# Patient Record
Sex: Male | Born: 1935 | Race: White | Hispanic: No | Smoking: Never smoker
Health system: Southern US, Community
[De-identification: ages and names within clinical notes are randomized; demographics above are authoritative.]

## PROBLEM LIST (undated history)

## (undated) DIAGNOSIS — G20 Parkinson's disease: Secondary | ICD-10-CM

## (undated) DIAGNOSIS — Z950 Presence of cardiac pacemaker: Secondary | ICD-10-CM

## (undated) DIAGNOSIS — E669 Obesity, unspecified: Secondary | ICD-10-CM

## (undated) DIAGNOSIS — R011 Cardiac murmur, unspecified: Secondary | ICD-10-CM

## (undated) DIAGNOSIS — M109 Gout, unspecified: Secondary | ICD-10-CM

## (undated) DIAGNOSIS — J449 Chronic obstructive pulmonary disease, unspecified: Secondary | ICD-10-CM

## (undated) DIAGNOSIS — I517 Cardiomegaly: Secondary | ICD-10-CM

## (undated) DIAGNOSIS — I509 Heart failure, unspecified: Secondary | ICD-10-CM

## (undated) DIAGNOSIS — K219 Gastro-esophageal reflux disease without esophagitis: Secondary | ICD-10-CM

## (undated) DIAGNOSIS — I10 Essential (primary) hypertension: Secondary | ICD-10-CM

## (undated) DIAGNOSIS — E785 Hyperlipidemia, unspecified: Secondary | ICD-10-CM

## (undated) DIAGNOSIS — G4730 Sleep apnea, unspecified: Secondary | ICD-10-CM

## (undated) DIAGNOSIS — E119 Type 2 diabetes mellitus without complications: Secondary | ICD-10-CM

## (undated) DIAGNOSIS — G4733 Obstructive sleep apnea (adult) (pediatric): Secondary | ICD-10-CM

## (undated) DIAGNOSIS — R739 Hyperglycemia, unspecified: Secondary | ICD-10-CM

## (undated) DIAGNOSIS — I2510 Atherosclerotic heart disease of native coronary artery without angina pectoris: Secondary | ICD-10-CM

## (undated) HISTORY — DX: Type 2 diabetes mellitus without complications: E11.9

## (undated) HISTORY — PX: APPENDECTOMY: SHX54

## (undated) HISTORY — DX: Parkinson's disease: G20

## (undated) HISTORY — DX: Obesity, unspecified: E66.9

## (undated) HISTORY — DX: Gout, unspecified: M10.9

## (undated) HISTORY — DX: Cardiac murmur, unspecified: R01.1

## (undated) HISTORY — DX: Hyperlipidemia, unspecified: E78.5

## (undated) HISTORY — DX: Presence of cardiac pacemaker: Z95.0

## (undated) HISTORY — DX: Gastro-esophageal reflux disease without esophagitis: K21.9

## (undated) HISTORY — DX: Hyperglycemia, unspecified: R73.9

## (undated) HISTORY — DX: Essential (primary) hypertension: I10

## (undated) HISTORY — PX: KNEE ARTHROSCOPY: SUR90

## (undated) HISTORY — DX: Atherosclerotic heart disease of native coronary artery without angina pectoris: I25.10

## (undated) HISTORY — DX: Sleep apnea, unspecified: G47.30

---

## 1999-11-27 ENCOUNTER — Ambulatory Visit: Admission: RE | Admit: 1999-11-27 | Discharge: 1999-11-27 | Payer: Self-pay | Admitting: Internal Medicine

## 2000-02-11 ENCOUNTER — Ambulatory Visit: Admission: RE | Admit: 2000-02-11 | Discharge: 2000-02-11 | Payer: Self-pay | Admitting: Internal Medicine

## 2004-08-05 HISTORY — PX: COLONOSCOPY: SHX174

## 2006-06-22 ENCOUNTER — Ambulatory Visit: Payer: Self-pay | Admitting: Internal Medicine

## 2006-10-24 ENCOUNTER — Ambulatory Visit: Payer: Self-pay | Admitting: Internal Medicine

## 2006-12-13 DIAGNOSIS — G20 Parkinson's disease: Secondary | ICD-10-CM

## 2006-12-13 DIAGNOSIS — G20A1 Parkinson's disease without dyskinesia, without mention of fluctuations: Secondary | ICD-10-CM

## 2006-12-13 HISTORY — DX: Parkinson's disease: G20

## 2006-12-13 HISTORY — PX: CORONARY ARTERY BYPASS GRAFT: SHX141

## 2006-12-13 HISTORY — DX: Parkinson's disease without dyskinesia, without mention of fluctuations: G20.A1

## 2007-07-28 ENCOUNTER — Ambulatory Visit: Payer: Self-pay | Admitting: Internal Medicine

## 2007-10-04 ENCOUNTER — Encounter: Payer: Self-pay | Admitting: Internal Medicine

## 2007-10-14 ENCOUNTER — Encounter: Payer: Self-pay | Admitting: Internal Medicine

## 2007-11-13 ENCOUNTER — Encounter: Payer: Self-pay | Admitting: Internal Medicine

## 2007-12-14 ENCOUNTER — Encounter: Payer: Self-pay | Admitting: Internal Medicine

## 2008-01-14 ENCOUNTER — Encounter: Payer: Self-pay | Admitting: Internal Medicine

## 2008-12-13 HISTORY — PX: CATARACT EXTRACTION: SUR2

## 2009-07-10 ENCOUNTER — Ambulatory Visit: Payer: Self-pay | Admitting: Ophthalmology

## 2009-07-21 ENCOUNTER — Ambulatory Visit: Payer: Self-pay | Admitting: Ophthalmology

## 2012-05-09 ENCOUNTER — Ambulatory Visit: Payer: Self-pay | Admitting: Ophthalmology

## 2012-05-22 ENCOUNTER — Ambulatory Visit: Payer: Self-pay | Admitting: Ophthalmology

## 2013-11-02 ENCOUNTER — Ambulatory Visit: Payer: Self-pay | Admitting: Internal Medicine

## 2014-08-26 ENCOUNTER — Encounter: Payer: Self-pay | Admitting: Neurology

## 2014-11-02 DIAGNOSIS — E1122 Type 2 diabetes mellitus with diabetic chronic kidney disease: Secondary | ICD-10-CM | POA: Insufficient documentation

## 2014-11-02 DIAGNOSIS — N182 Chronic kidney disease, stage 2 (mild): Secondary | ICD-10-CM

## 2014-11-11 DIAGNOSIS — Z6835 Body mass index (BMI) 35.0-35.9, adult: Secondary | ICD-10-CM

## 2014-12-13 HISTORY — PX: PACEMAKER PLACEMENT: SHX43

## 2014-12-24 ENCOUNTER — Emergency Department: Payer: Self-pay | Admitting: Emergency Medicine

## 2014-12-24 LAB — CBC
HCT: 43.2 % (ref 40.0–52.0)
HGB: 14.1 g/dL (ref 13.0–18.0)
MCH: 30 pg (ref 26.0–34.0)
MCHC: 32.7 g/dL (ref 32.0–36.0)
MCV: 92 fL (ref 80–100)
Platelet: 158 10*3/uL (ref 150–440)
RBC: 4.71 10*6/uL (ref 4.40–5.90)
RDW: 14.1 % (ref 11.5–14.5)
WBC: 10.8 10*3/uL — ABNORMAL HIGH (ref 3.8–10.6)

## 2014-12-24 LAB — COMPREHENSIVE METABOLIC PANEL
ALT: 10 U/L — AB
ANION GAP: 6 — AB (ref 7–16)
Albumin: 3.5 g/dL (ref 3.4–5.0)
Alkaline Phosphatase: 65 U/L
BILIRUBIN TOTAL: 0.5 mg/dL (ref 0.2–1.0)
BUN: 16 mg/dL (ref 7–18)
CHLORIDE: 109 mmol/L — AB (ref 98–107)
CREATININE: 1.02 mg/dL (ref 0.60–1.30)
Calcium, Total: 8.7 mg/dL (ref 8.5–10.1)
Co2: 28 mmol/L (ref 21–32)
EGFR (Non-African Amer.): >60
GLUCOSE: 101 mg/dL — AB (ref 65–99)
Osmolality: 286 (ref 275–301)
POTASSIUM: 4 mmol/L (ref 3.5–5.1)
SGOT(AST): 29 U/L (ref 15–37)
Sodium: 143 mmol/L (ref 136–145)
Total Protein: 7 g/dL (ref 6.4–8.2)

## 2014-12-24 LAB — TROPONIN I
Troponin-I: <0.02 ng/mL
Troponin-I: 0.02 ng/mL

## 2014-12-25 ENCOUNTER — Inpatient Hospital Stay: Payer: Self-pay | Admitting: Internal Medicine

## 2014-12-25 LAB — URINALYSIS, COMPLETE
Bacteria: NONE SEEN
Bilirubin,UR: NEGATIVE
Blood: NEGATIVE
Glucose,UR: NEGATIVE mg/dL
Hyaline Cast: 2
Leukocyte Esterase: NEGATIVE
Nitrite: NEGATIVE
Ph: 5
Protein: 30
RBC,UR: 1 /HPF
Specific Gravity: 1.029
Squamous Epithelial: 1
WBC UR: 6 /HPF

## 2014-12-25 LAB — COMPREHENSIVE METABOLIC PANEL WITH GFR
Albumin: 3.4 g/dL
Alkaline Phosphatase: 75 U/L
Anion Gap: 6 — ABNORMAL LOW
BUN: 17 mg/dL
Bilirubin,Total: 1 mg/dL
Calcium, Total: 8.5 mg/dL
Chloride: 105 mmol/L
Co2: 28 mmol/L
Creatinine: 1.01 mg/dL
EGFR (African American): >60
EGFR (Non-African Amer.): >60
Glucose: 135 mg/dL — ABNORMAL HIGH
Osmolality: 281
Potassium: 3.7 mmol/L
SGOT(AST): 22 U/L
SGPT (ALT): 11 U/L — ABNORMAL LOW
Sodium: 139 mmol/L
Total Protein: 7.2 g/dL

## 2014-12-25 LAB — CBC WITH DIFFERENTIAL/PLATELET
BASOS ABS: 0 10*3/uL (ref 0.0–0.1)
Basophil %: 0.3 %
EOS ABS: 0 10*3/uL (ref 0.0–0.7)
Eosinophil %: 0.4 %
HCT: 44.4 % (ref 40.0–52.0)
HGB: 14.4 g/dL (ref 13.0–18.0)
LYMPHS ABS: 0.6 10*3/uL — AB (ref 1.0–3.6)
Lymphocyte %: 5 %
MCH: 29.4 pg (ref 26.0–34.0)
MCHC: 32.4 g/dL (ref 32.0–36.0)
MCV: 91 fL (ref 80–100)
MONO ABS: 0.5 x10 3/mm (ref 0.2–1.0)
Monocyte %: 4.2 %
NEUTROS ABS: 10.3 10*3/uL — AB (ref 1.4–6.5)
Neutrophil %: 90.1 %
Platelet: 167 10*3/uL (ref 150–440)
RBC: 4.89 10*6/uL (ref 4.40–5.90)
RDW: 14 % (ref 11.5–14.5)
WBC: 11.4 10*3/uL — AB (ref 3.8–10.6)

## 2014-12-26 LAB — BASIC METABOLIC PANEL
Anion Gap: 7 (ref 7–16)
BUN: 16 mg/dL (ref 7–18)
CHLORIDE: 106 mmol/L (ref 98–107)
CREATININE: 0.99 mg/dL (ref 0.60–1.30)
Calcium, Total: 8.3 mg/dL — ABNORMAL LOW (ref 8.5–10.1)
Co2: 27 mmol/L (ref 21–32)
EGFR (African American): >60
EGFR (Non-African Amer.): >60
Glucose: 109 mg/dL — ABNORMAL HIGH (ref 65–99)
Osmolality: 281 (ref 275–301)
POTASSIUM: 4.1 mmol/L (ref 3.5–5.1)
SODIUM: 140 mmol/L (ref 136–145)

## 2014-12-26 LAB — CBC WITH DIFFERENTIAL/PLATELET
BASOS ABS: 0 10*3/uL (ref 0.0–0.1)
Basophil %: 0.3 %
Eosinophil #: 0 10*3/uL (ref 0.0–0.7)
Eosinophil %: 0.6 %
HCT: 39.7 % — ABNORMAL LOW (ref 40.0–52.0)
HGB: 13 g/dL (ref 13.0–18.0)
LYMPHS ABS: 0.6 10*3/uL — AB (ref 1.0–3.6)
Lymphocyte %: 6.8 %
MCH: 29.7 pg (ref 26.0–34.0)
MCHC: 32.7 g/dL (ref 32.0–36.0)
MCV: 91 fL (ref 80–100)
MONO ABS: 0.4 x10 3/mm (ref 0.2–1.0)
Monocyte %: 5 %
Neutrophil #: 7.7 10*3/uL — ABNORMAL HIGH (ref 1.4–6.5)
Neutrophil %: 87.3 %
Platelet: 149 10*3/uL — ABNORMAL LOW (ref 150–440)
RBC: 4.38 10*6/uL — ABNORMAL LOW (ref 4.40–5.90)
RDW: 14 % (ref 11.5–14.5)
WBC: 8.9 10*3/uL (ref 3.8–10.6)

## 2014-12-30 LAB — CBC WITH DIFFERENTIAL/PLATELET
Basophil #: 0.1 10*3/uL (ref 0.0–0.1)
Basophil %: 0.7 %
EOS ABS: 0.3 10*3/uL (ref 0.0–0.7)
Eosinophil %: 3.3 %
HCT: 40.4 % (ref 40.0–52.0)
HGB: 13.4 g/dL (ref 13.0–18.0)
Lymphocyte #: 0.9 10*3/uL — ABNORMAL LOW (ref 1.0–3.6)
Lymphocyte %: 11 %
MCH: 30 pg (ref 26.0–34.0)
MCHC: 33.2 g/dL (ref 32.0–36.0)
MCV: 90 fL (ref 80–100)
MONO ABS: 0.6 x10 3/mm (ref 0.2–1.0)
Monocyte %: 7.3 %
NEUTROS PCT: 77.7 %
Neutrophil #: 6.6 10*3/uL — ABNORMAL HIGH (ref 1.4–6.5)
Platelet: 182 10*3/uL (ref 150–440)
RBC: 4.47 10*6/uL (ref 4.40–5.90)
RDW: 14.2 % (ref 11.5–14.5)
WBC: 8.4 10*3/uL (ref 3.8–10.6)

## 2014-12-30 LAB — BASIC METABOLIC PANEL
ANION GAP: 4 — AB (ref 7–16)
BUN: 16 mg/dL (ref 7–18)
CALCIUM: 8.7 mg/dL (ref 8.5–10.1)
CO2: 34 mmol/L — AB (ref 21–32)
CREATININE: 0.96 mg/dL (ref 0.60–1.30)
Chloride: 104 mmol/L (ref 98–107)
EGFR (African American): >60
EGFR (Non-African Amer.): >60
Glucose: 112 mg/dL — ABNORMAL HIGH (ref 65–99)
Osmolality: 285 (ref 275–301)
Potassium: 4.1 mmol/L (ref 3.5–5.1)
Sodium: 142 mmol/L (ref 136–145)

## 2014-12-30 LAB — APTT: ACTIVATED PTT: 32.2 s (ref 23.6–35.9)

## 2014-12-30 LAB — PROTIME-INR
INR: 1
PROTHROMBIN TIME: 13.1 s (ref 11.5–14.7)

## 2015-01-08 ENCOUNTER — Ambulatory Visit: Payer: Self-pay | Admitting: Surgery

## 2015-04-03 ENCOUNTER — Encounter
Admit: 2015-04-03 | Disposition: A | Discharge status: Home / Self Care | Payer: Self-pay | Attending: Neurology | Admitting: Neurology

## 2015-04-06 NOTE — Op Note (Signed)
PATIENT NAME:  Jermaine [name], Nero G MR#:  [id number] DATE OF BIRTH:  1929-06-10  DATE OF PROCEDURE:  05/22/2012  PREOPERATIVE DIAGNOSIS:  Cataract, right eye.   POSTOPERATIVE DIAGNOSIS:  Cataract, right eye.  PROCEDURE PERFORMED:  Extracapsular cataract extraction using phacoemulsification with placement of an Alcon SN6CWS 20.0-diopter posterior chamber lens, serial #[id number].  SURGEON:  Maylon PeppersSteven A. Demitris Pokorny, MD  ASSISTANT:  None.  ANESTHESIA:  4% lidocaine and 0.75% Marcaine in a 50/50 mixture with 10 units/mL of Hylenex added, given as a peribulbar.   ANESTHESIOLOGIST:  Dr. Dimple Caseyice.   COMPLICATIONS:  None.  ESTIMATED BLOOD LOSS:  Less than 1 ml.  DESCRIPTION OF PROCEDURE:  The patient was brought to the operating room and given a peribulbar block.  The patient was then prepped and draped in the usual fashion.  The vertical rectus muscles were imbricated using 5-0 silk sutures.  These sutures were then clamped to the sterile drapes as bridle sutures.  A limbal peritomy was performed extending two clock hours and hemostasis was obtained with cautery.  A partial thickness scleral groove was made at the surgical limbus and dissected anteriorly in a lamellar dissection using an Alcon crescent knife.  The anterior chamber was entered superonasally with a Superblade and through the lamellar dissection with a 2.6 mm keratome.  DisCoVisc was used to replace the aqueous and a continuous tear capsulorrhexis was carried out.  Hydrodissection and hydrodelineation were carried out with balanced salt and a 27 gauge canula.  The nucleus was rotated to confirm the effectiveness of the hydrodissection.  Phacoemulsification was carried out using a divide-and-conquer technique.  Total ultrasound time was one minute and thirty-two seconds with an average power of 20.3 percent.  Irrigation/aspiration was used to remove the residual cortex.  DisCoVisc was used to inflate the capsule and the internal incision was  enlarged to 3 mm with the crescent knife.  The intraocular lens was folded and inserted into the capsular bag using the AcrySert delivery system.  Irrigation/aspiration was used to remove the residual DisCoVisc.  Miostat was injected into the anterior chamber through the paracentesis track to inflate the anterior chamber and induce miosis.  The wound was checked for leaks and none were found. The conjunctiva was closed with cautery and the bridle sutures were removed.  Two drops of 0.3% Vigamox were placed on the eye.   An eye shield was placed on the eye.  The patient was discharged to the recovery room in good condition.   ____________________________ Maylon PeppersSteven A. Lance Galas, MD sad:ap D: 05/22/2012 13:23:09 ET T: 05/22/2012 14:52:48 ET JOB#: [id number]  cc: Viviann SpareSteven A. Lilliann Rossetti, MD, <Dictator> Erline LevineSTEVEN A Silas Sedam MD ELECTRONICALLY SIGNED 05/29/2012 13:01

## 2015-04-13 NOTE — Op Note (Signed)
PATIENT NAME:  Jermaine [name], Jermaine [name] MR#:  [id number] DATE OF BIRTH:  27-Oct-1929  DATE OF PROCEDURE:  12/30/2014  PRIMARY CARE PHYSICIAN: Einar CrowMarshall Anderson, MD  PREPROCEDURE DIAGNOSIS: Sinus arrest.   PROCEDURE: Dual-chamber pacemaker implantation.  POSTPROCEDURAL DIAGNOSIS: Atrial pacing with ventricular sensing.   INDICATION: The patient is an 81 year old gentleman with known CAD with history of atrial fibrillation who presented after a syncopal episode with fractured ribs and pneumothorax. Following hospitalization, the patient has had episodes of sinus pauses of up to 6 seconds.   DESCRIPTION OF PROCEDURE: The risks, benefits, and alternatives of pacemaker implantation were explained to the patient and informed written consent was obtained.   DESCRIPTION OF PROCEDURE: He was brought to the operating room in a fasting state. The left pectoral region was prepped and draped in the usual sterile manner. Anesthesia was obtained with 1% Xylocaine locally. A 6 cm incision was performed over the left pectoral region. The pacemaker pocket was generated by electrocautery and blunt dissection. Access was obtained to the left subclavian vein by fine needle aspiration. Ventricular and atrial MRI compatible leads were positioned to right ventricular apical septum and right atrial appendage under fluoroscopic guidance. After proper thresholds were obtained, the leads were sutured in place. The pacemaker pocket was irrigated with gentamicin solution. The leads were connected to a dual-chamber rate responsive MRI compatible pacemaker generator (Medtronic [id number]) and positioned into the pocket. The pocket was closed with 2-0 and 4-0 Vicryl, respectively. Steri-Strips and pressure dressing were applied.  ____________________________ Jermaine MillardAlexander Lyrik Buresh, MD ap:sb D: 12/30/2014 13:33:45 ET T: 12/30/2014 14:15:59 ET JOB#: [id number]  cc: Jermaine MillardAlexander Francisco Ostrovsky, MD, <Dictator> Jermaine MillardALEXANDER Ivori Storr MD ELECTRONICALLY SIGNED  01/01/2015 18:07

## 2015-04-13 NOTE — Discharge Summary (Signed)
PATIENT NAME:  Jermaine [name], [name] MR#:  [id number] DATE OF BIRTH:  [birth year]  DATE OF ADMISSION:  12/25/2014 DATE OF DISCHARGE:  01/01/2015  DISCHARGE DIAGNOSES:  1. Pneumothorax.  2. Rib fractures.  3. Syncope.  4. Intermittent complete heart block requiring a pacemaker. 5. Permanent pacemaker.  6. Parkinson disease.   DISCHARGE MEDICATIONS: [facility]. Please see for details. Notably will be off spironolactone and amiodarone per cardiology. They will need to watch for rapid atrial fibrillation and the pacemaker. Also, will not be anticoagulated until the risk of falls is clearly improved. We are also decreasing doxazosin 2 mg daily to help with orthostatic hypotension. May consider titrating that off and/or decreasing isosorbide if need be, and also possibly adding thigh-high TED hose.   HISTORY AND PHYSICAL: Please see detailed history and physical done on admission.   HOSPITAL COURSE: The patient was admitted with the above, followed by surgery for the pneumothorax and rib fractures. Ultimately found to have periodic complete heart block with pauses up to 5 seconds. Permanent pacemaker was recommended by Dr. Gwen PoundsKowalski, and Dr. Darrold JunkerParaschos placed that. Dr. Thelma Bargeaks saw the patient about the pneumothorax. A chest tube was placed and eventually withdrawn without any further accumulation of air. Last chest x-ray was January 18.   Notably, the patient was felt to be ready to be discharged by all consultants and the surgery service. No one had seen him from surgery in a couple of days, so I  will discharge the patient today if okay with general surgery. We will try to check with them.   Notably, a CT scan from earlier in the admission showed the above rib fractures, tiny gallstones only, which did not appear to be symptomatic.   TIME SPENT: Please note it took approximately 35 minutes to do all discharge tasks today.   ____________________________ Marya AmslerMarshall W. Dareen PianoAnderson,  MD mwa:JT D: 01/01/2015 07:56:56 ET T: 01/01/2015 08:59:23 ET JOB#: [id number]  cc: Marya AmslerMarshall W. Dareen PianoAnderson, MD, <Dictator> Lauro RegulusMARSHALL W ANDERSON MD ELECTRONICALLY SIGNED 01/02/2015 13:13

## 2015-04-13 NOTE — Consult Note (Signed)
PATIENT NAME:  Jermaine [name], Jermaine [name] MR#:  [id number] DATE OF BIRTH:  03-10-1929  DATE OF CONSULTATION:  12/25/2014  REFERRING PHYSICIAN:  Carmie Endalph L. Ely III, MD  CONSULTING PHYSICIAN:  Cletis Athensavid K. Hower, MD  PRIMARY CARE PHYSICIAN: Marya AmslerMarshall W. Dareen PianoAnderson, MD, of [facility].  REASON FOR CONSULTATION: Management of medical issues  HISTORY OF PRESENT ILLNESS: This is an 81 year old Caucasian gentleman with history of coronary artery disease status post CABG, as well as a history of Parkinson's, essential hypertension, who originally presented on 12/24/2014 after a syncopal episode. At that time it sounds as though it was positional in etiology, going from sitting to standing, he felt lightheaded, followed by a syncopal episode with loss of consciousness. No loss of bowel or bladder function. No head trauma. He woke up promptly without any postictal confusion. Had full workup in the Emergency Department. The case was [facility], and he was discharged from the Emergency Department. Of note, he recently started spironolactone about 2 weeks prior to this and it was felt that he had orthostatic hypotension related to the change in medications. This was discharged. Also of note, he previously had syncopal episodes and underwent Holter monitoring, which was negative for any acute findings. Once again, discharged home. While at home the day of admission, 12/25/2014, noticed worsening right-sided chest pain, mid axillary in location, "pain" in quality, 8-9 out of 10 intensity, worse with movement and breathing, no relieving factors. Thus, represented to the hospital for further workup and evaluation. Found to have acute right-sided rib fractures of 7, 8, and 9 with a small pneumothorax. He was admitted by Dr. Michela PitcherEly of general surgery at that time. Medical consult requested for medical management as stated above.   REVIEW OF SYSTEMS:  CONSTITUTIONAL: Denies any fevers,  chills, weakness, fatigue.  EYES: Denies blurred vision, double vision, or eye pain.  EARS, NOSE, THROAT: Denies tinnitus, ear pain, hearing loss.  RESPIRATORY: Denies any cough, wheeze, shortness of breath.  CARDIOVASCULAR: Positive for chest pain as described above. Denies any palpitations or edema.  GASTROINTESTINAL: Denies nausea, vomiting, diarrhea, or abdominal pain.  GENITOURINARY: Denies dysuria or hematuria.  ENDOCRINE: Denies nocturia or thyroid problems.  HEMATOLOGIC AND LYMPHATIC: Denies easy bruising, bleeding.  SKIN: Denies rashes or lesions.  MUSCULOSKELETAL: Denies pain in head, neck, back, shoulder, knees, hips, or arthritic symptoms.  NEUROLOGIC: Denies paralysis or paresthesias.  PSYCHIATRIC: Denies anxiety or depressive symptoms. Otherwise a full review of systems performed by me is negative.   PAST MEDICAL HISTORY: Coronary artery disease, status post CABG; Parkinson disease; essential hypertension; as well as gout.   SOCIAL HISTORY: No alcohol, tobacco, or drug usage.   FAMILY HISTORY: No known cardiovascular or pulmonary disorders.   ALLERGIES: No known drug allergies.   HOME MEDICATIONS: The patient did not bring his medication list, however, per our documentation: Acetaminophen and oxycodone 325/5 mg p.o. q 6 hours as needed for pain; allopurinol 3 mg 1/2 tablet p.o. daily; amiodarone 200 mg p.o. daily; Norvasc 10 mg p.o. at bedtime; aspirin 325 mg p.o. at bedtime; Sinemet 25/250 mg 2 tablets p.o. 4 times daily; Dilaudid 2 mg p.o. 4 times daily as needed for pain; doxazosin 8 mg 1/2 tablet p.o. daily; hydrochlorothiazide/lisinopril 12.5/20 mg p.o. daily; ibuprofen 600 mg 3 times daily as needed for pain; Imdur 30 mg p.o. daily; melatonin 5 mg p.o. at bedtime; MiraLax 17 grams daily as needed for constipation; potassium 20 mEq p.o. daily; Requip 1 mg p.o. 4  times daily; spironolactone 25 mg p.o. daily; vitamin D2 at 50,000 international units p.o. q. weekly; vitamin D3  at 1000 international units p.o. daily.   PHYSICAL EXAMINATION:  VITAL SIGNS: Temperature 97.4, heart rate 84, respirations 20, blood pressure 156/53, saturating 95% on 2 L nasal cannula. Weight 109.5 kg, BMI 36.7.  GENERAL: Obese, Caucasian gentleman, currently in minimal distress given right-sided rib pain.  HEAD: Normocephalic, atraumatic.  EYES: Pupils equal, round, reactive to reactive to light. Extraocular muscles intact. No scleral icterus.  MOUTH: Moist mucosal membrane. Dentition intact. No abscess noted.  EAR, NOSE, THROAT: Clear without exudates. No external lesions.  NECK: Supple. No thyromegaly. No nodules. No JVD.  PULMONARY: Diminished breath sounds secondary to poor respiratory effort. Otherwise, no frank wheezes, rales, or rhonchi.  CHEST: Tender to palpation of right side as expected.  CARDIOVASCULAR: S1, S2, regular rate and rhythm. No murmurs, rubs, or gallops. No edema. Pedal pulses 2+ bilaterally.   GASTROINTESTINAL: Soft, nontender, nondistended. No masses. Positive bowel sounds. No hepatosplenomegaly.   MUSCULOSKELETAL: No swelling, clubbing, or edema. Range of motion full in all extremities.  NEUROLOGIC: Cranial nerves II-XII intact. No gross focal neurological deficits. Sensation intact. Reflexes intact.  SKIN: No ulceration, lesions, rash, cyanosis. Skin warm and dry. Turgor intact.  PSYCHIATRIC: Mood and affect are within normal limits. Patient awake, alert, providing history. Insight and judgment intact.   LABORATORY DATA: Sodium of 139, potassium 3.7, chloride 105, bicarbonate 28, BUN 17, creatinine 1.01, glucose 135. LFTs within normal limits. WBC of 11.4, hemoglobin 14.4, platelets of 167,000. Urinalysis negative for evidence of infection. CT of the abdomen and pelvis performed today revealing mildly displaced right seventh through ninth rib fractures with a small right-sided pneumothorax.   ASSESSMENT AND PLAN: An 81 year old Caucasian gentleman with a history of  coronary artery disease, status post coronary artery bypass graft; Parkinson disease; as well as essential hypertension who is presenting after a syncopal episode, found to have a right-sided rib fractures.  1.  Right-sided rib fractures, acute initial visit: Provide pain control. Currently on p.r.n. Dilaudid. If it remains uncontrolled we will place him on PCA. We will also initiate bowel regimen and incentive spirometry.  2.  Right-sided pneumothorax: Repeat chest x-ray in the morning and we will defer care to surgery.  3.  Hypertension: Hold spironolactone. Continue with Norvasc, Imdur, hydrochlorothiazide, and lisinopril. If blood pressure lowers or is marginal, we will discontinue hydrochlorothiazide at that time but can continue for now.  4.  Hyperlipidemia: Continue statin therapy.  5.  Parkinson's: Continue Sinemet.   CODE STATUS: The patient is full code.   TIME SPENT: 45 minutes.    ____________________________ Cletis Athens. Hower, MD dkh:bm D: 12/25/2014 23:28:00 ET T: 12/26/2014 02:00:10 ET JOB#: [id number]  cc: Cletis Athens. Hower, MD, <Dictator> DAVID Synetta Shadow MD ELECTRONICALLY SIGNED 12/27/2014 1:01

## 2015-04-13 NOTE — Consult Note (Signed)
PATIENT NAME:  Jermaine [name], Jermaine [name] MR#:  [id number] DATE OF BIRTH:  Jun 14, 1929  DATE OF CONSULTATION:  12/28/2014  CONSULTING PHYSICIAN:  Tiney Rouge, MD  CONSULTED PHYSICIAN:  Lamar Blinks, MD  REASON FOR CONSULTATION: Acute syncope with injury, atrial fibrillation, bypass surgery, and hypertension.   CHIEF COMPLAINT: "I passed out."   HISTORY OF PRESENT ILLNESS:  This is an 81 year old male with known coronary disease status post coronary artery bypass graft, essential hypertension, mixed hyperlipidemia, atrial fibrillation for which the patient has had intermittent episodes of syncope. The patient has a recent episode of syncope where he fell and hurt his ribs.  He is currently having chest discomfort and other significant symptoms, but not consistent with true angina, more consistent with injury. He has a normal troponin and an EKG showing normal sinus rhythm.  The patient did have, by telemetry, a long pause greater than 3 to 6 seconds multiple times with associated syncopal episode and heart block. The patient has not had any residual issues after this syncope.   REVIEW OF SYSTEMS:  Remainder of review of systems is negative for vision change, ringing in the ears, hearing loss, cough, congestion, heartburn, nausea, vomiting, diarrhea, bloody stools, stomach pain, extremity pain, leg weakness, cramping in buttocks, known blood clots, headaches, nosebleeds, congestion, trouble swallowing, frequent urination, urination at night, muscle weakness, numbness, anxiety, depression, skin lesions or skin rashes.   PAST MEDICAL HISTORY:  1.  Known coronary artery disease.  2.  Mixed hyperlipidemia.  3.  Atrial fibrillation. 4.  Essential hypertension.   FAMILY HISTORY: A few family members have hypertension, but no other cardiovascular disease.   SOCIAL HISTORY: Currently denies alcohol or tobacco use.   ALLERGIES: As listed.   MEDICATIONS: As listed.   PHYSICAL EXAMINATION:  VITAL SIGNS:   Blood pressure is 118/62 bilaterally. Heart rate is 58 upright, reclining, and regular.  GENERAL: He is a well-appearing male in no acute distress. Head, eyes, ears, nose, throat: No icterus, thyromegaly, ulcers, hemorrhage, or xanthelasma.  CARDIOVASCULAR: Regular rate and rhythm. Normal S1, S2. Diffuse heart sounds. No apparent murmur, gallop, or rub. PMI is inferiorly displaced. Carotid upstroke normal without bruit. Jugular venous pressure is normal.  LUNGS: Have few basilar crackles with normal respirations.  ABDOMEN: Soft, nontender, without hepatosplenomegaly or masses. Abdominal aorta is normal size without bruit.  EXTREMITIES: 2+ radial, femoral, trace dorsal pedal pulses, with 1+ lower extremity edema. No cyanosis, clubbing or ulcers.  NEUROLOGIC: He is oriented to time, place, and person, with normal mood and affect.   ASSESSMENT: An 81 year old male with known coronary disease, status post coronary artery bypass graft, essential hypertension, mixed hyperlipidemia with atrial fibrillation and now sick sinus syndrome with heart block and long pauses greater than 3 to 6 seconds associated with syncope.   RECOMMENDATIONS:  1.  Discontinuation of amiodarone, which may exacerbate above.  2.  No anticoagulation at this time due to possibly needing pacemaker placement to reduce bleeding complications.  3.  Consider echocardiogram for LV systolic dysfunction, valvular heart disease contributing to above.  4.  Proceed to pacemaker placement, dual chamber placement, if able, for further risk reduction of syncope with long pauses and sick sinus syndrome. The patient understands the risks and benefits of pacemaker placement. These include the possibility of death, stroke, heart attack, infection, bleeding, blood clot, hemopericardium, pneumothorax and reaction to medications. The patient is at low risk for general anesthesia.     ____________________________ Lamar Blinks,  MD bjk:DT D:  12/28/2014 09:58:14 ET T: 12/28/2014 13:35:50 ET JOB#: [id number]  cc: Lamar BlinksBruce J. Kowalski, MD, <Dictator> Lamar BlinksBRUCE J KOWALSKI MD ELECTRONICALLY SIGNED 12/30/2014 13:28

## 2015-04-13 NOTE — Op Note (Signed)
PATIENT NAME:  Jermaine [name], [name] MR#:  [id number] DATE OF BIRTH:  03-Jun-1929  DATE OF PROCEDURE:  12/26/2014  PREOPERATIVE DIAGNOSIS: Traumatic pneumothorax, right.   POSTOPERATIVE DIAGNOSIS: Traumatic pneumothorax, right.   OPERATION: Right tube thoracostomy.   SURGEON: Quentin Orealph L. Ely III, MD  ANESTHESIA: Local.   DESCRIPTION OF PROCEDURE: After the induction of intravenous premedication, the patient was placed in the left side down, right side up position. His right chest was prepped with Betadine and draped with sterile towels; 0.25% Marcaine was used to provide anesthesia. A small incision was made in the anterior axillary line and a 20-French chest tube inserted without difficulty. The tube returned to air, was directed toward the apex of the chest. The tube was secured to the chest wall with silk sutures, sterilely dressed, and attached to a Pleur-evac. Chest x-ray is pending.   ____________________________ Carmie Endalph L. Ely III, MD rle:ST D: 12/26/2014 16:16:47 ET T: 12/26/2014 21:47:00 ET JOB#: [id number]  cc: Quentin Orealph L. Ely III, MD, <Dictator> Quentin OreALPH L ELY MD ELECTRONICALLY SIGNED 12/28/2014 18:42

## 2015-04-13 NOTE — Consult Note (Signed)
Brief Consult Note: Diagnosis: 7,8,9 right rib fractures, acute.   Patient was seen by consultant.   Consult note dictated.   Orders entered.   Comments: 85yCM pmh cad s/p cabg, parkinsons, htn presented 12-24-14 after syncopal episode, positional, had work up in ED, case d/w cardio - d/c'd spironlactone and discharged (of not had holter for previous syncopal episode - negative) while at home worsening right sided chest pain - mid axillary 8/10, worse with movement/breathing - represent to ED, found to have acute right sided rib fx 7,8,9. with ptx - pt admitted by gen surgery  1. right sided rib fractures, acute/initial: pain control - prn diluadid for now - if remains uncontrolled, place on pca, add bowel reg, add is  2. right ptx: follow cxr, defer care to surg  3. htn: hold spironolactone, continue norvasc, imdur, htcz/lisinopril -- follow bp if remains marginal dc htcz  4. hld: statin  5. parkinsons: sinemet full 45min.  Electronic Signatures: Suede Greenawalt, Cletis Athensavid K (MD)  (Signed 13-Jan-16 23:21)  Authored: Brief Consult Note   Last Updated: 13-Jan-16 23:21 by Wyatt HasteHower, Betta Balla K (MD)

## 2015-04-15 ENCOUNTER — Ambulatory Visit: Payer: Medicare Other | Attending: Neurology

## 2015-04-15 DIAGNOSIS — M25519 Pain in unspecified shoulder: Secondary | ICD-10-CM | POA: Insufficient documentation

## 2015-04-15 DIAGNOSIS — M7501 Adhesive capsulitis of right shoulder: Secondary | ICD-10-CM | POA: Diagnosis present

## 2015-04-15 DIAGNOSIS — M6281 Muscle weakness (generalized): Secondary | ICD-10-CM | POA: Diagnosis not present

## 2015-04-15 DIAGNOSIS — M25511 Pain in right shoulder: Secondary | ICD-10-CM

## 2015-04-15 DIAGNOSIS — M7502 Adhesive capsulitis of left shoulder: Secondary | ICD-10-CM | POA: Insufficient documentation

## 2015-04-15 DIAGNOSIS — M25512 Pain in left shoulder: Secondary | ICD-10-CM

## 2015-04-15 NOTE — Therapy (Signed)
[place] MAIN [facility] SERVICES [address] [place], [place], [address] Phone: [phone]   Fax:  [phone]  Physical Therapy Treatment  Patient Details  Name: Jermaine TAGLIERI Sr. MRN: [id number] Date of Birth: 1929/03/15 Referring Provider:  Vladimir Crofts, MD  Encounter Date: 04/15/2015      PT End of Session - 04/15/15 1411    Visit Number 4   Number of Visits 8   Date for PT Re-Evaluation 04/29/15   Authorization Type medicare G code 4of 10   PT Start Time 4034   PT Stop Time 1350   PT Time Calculation (min) 45 min   Activity Tolerance Patient tolerated treatment well;No increased pain   Behavior During Therapy [facility] for tasks assessed/performed      Past Medical History  Diagnosis Date  . Hypertension   . Diabetes mellitus without complication   . Parkinson's disease 2008    Past Surgical History  Procedure Laterality Date  . 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 Pacemaker placement N/A 2016    There were no vitals filed for this visit.  Visit Diagnosis:  Bilateral shoulder pain  Muscle weakness      Subjective Assessment - 04/15/15 1402    Subjective pt reports he feels his shoulders are a little better. reports less pain with movement. pt reports being able to reach dishes in his cabinets better.    Patient Stated Goals maximize shoulder function without surgery   Currently in Pain? Yes   Pain Score 4    Pain Location Shoulder   Pain Orientation Right;Left   Pain Descriptors / Indicators Aching   Aggravating Factors  only with movement             [facility] PT Assessment - 04/15/15 0001    Assessment   Medical Diagnosis "adhesive capsulitis of both shoulders"   Onset Date 12/30/14   Precautions   Precautions Fall   Restrictions   Weight Bearing Restrictions No   Balance Screen   Has the patient fallen in the past 6 months Yes   How many times? 1   Has the patient had a decrease in activity  level because of a fear of falling?  No   Is the patient reluctant to leave their home because of a fear of falling?  No   Prior Function   Level of Independence Independent with basic ADLs;Needs assistance with ADLs   Observation/Other Assessments   Quick DASH  45% disability   ROM / Strength   AROM / PROM / Strength AROM;Strength   AROM   Overall AROM  Within functional limits for tasks performed   Overall AROM Comments pt has full ROM of both shoulders   AROM Assessment Site Shoulder   Right/Left Shoulder Right;Left   Strength   Overall Strength Comments pt demosntrates 3-/5 B shoulder flexion strength, 3-/5 abduction, 4/5 IR. pts L shoulder demonstrates 3-/5 ER, R shoulder 2-/5 ER strength.    Special Tests    Special Tests Rotator Cuff Impingement   Rotator Cuff Impingment tests Neer impingement test;Hawkins- Nordstrom;Hornblowers Sign;Empty Can test;Drop Arm test;Painful Arc of Motion   Neer Impingement test    Findings Positive   Side Right   Hawkins-Kennedy test   Findings Positive   Side --  bilaterally   Belly Press   Findings Positive   Side Right   Hornblowers Sign   Findings Positive   Side --  bilaterally   Empty Can test   Findings Positive   Side --  bilaterally   Drop Arm test   Findings Positive   Side Right   Painful Arc of Motion   Findings Positive   Side --  Laurell Josephs Adult PT Treatment/Exercise - 04/15/15 0001    Exercises   Exercises Shoulder   Shoulder Exercises: Supine   Protraction AROM;Strengthening;Both     pt performed the following: pullies into flexion/abd x 15 each Finger ladder x 5 each side into flexion Low row: yellow band 3x10 Manual resist ER/IR in supine 2x10 bilaterally Bent over row, shoulder extension 2lbs 2x10 each bilaterally Bent over shoulder horiz abduction 2x10 bilaterally- 0lbs Supine ABCs - yellow med ball CW/CCW x 15 each Supine shoulder flexion 0lbs x  15             PT Education - 04/15/15 1410    Education provided Yes   Education Details emphasized HEP for home AROM/AAROM and strengthening as given at eval and previous f/u   Person(s) Educated Patient   Methods Explanation   Comprehension Verbalized understanding          PT Short Term Goals - 04/15/15 1414    PT SHORT TERM GOAL #1   Title pt to report resting pain decreased by 2 grades, pain with PROM 3-4/10    Time 2   Period Weeks   Status Partially Met   PT SHORT TERM GOAL #2   Title pt to demonstrate independence with HEP for scapula stabilization as well as [place] RTC active ROM below 90deg   Time 2   Period Weeks   Status Partially Met           PT Long Term Goals - 04/15/15 1415    PT LONG TERM GOAL #1   Title pt to report overall DASH improvement >15pts    Time 4   Period Weeks   Status On-going   PT LONG TERM GOAL #2   Title pt to demonstrate AFE> 120-145 deg with min substitution overhead reach 3/3 trials    Time 4   Period Weeks   Status Partially Met   PT LONG TERM GOAL #3   Title pt will improve AROM UEs so they are able to perform overhead ADLs such as reaching into cabinets   Time 4   Period Weeks   Status Partially Met               Plan - 04/15/15 1412    Clinical Impression Statement pt is demonsrtating good improvement with less pain with shoulder flexion/abduciton and functional use of arm to perform ADLs. pt requires min cues to perform exercises properly during session. less pain with exercises also.    Pt will benefit from skilled therapeutic intervention in order to improve on the following deficits Decreased range of motion;Postural dysfunction;Pain;Impaired UE functional use;Decreased strength   Rehab Potential Fair   Clinical Impairments Affecting Rehab Potential possible RTC tear   PT Frequency 2x / week   PT Duration 4 weeks   PT Treatment/Interventions Moist Heat;Cryotherapy;Therapeutic activities;Therapeutic  exercise;Manual techniques;Passive range of motion   PT Next Visit Plan progress HEP as tolerated        Problem List There are no active problems to display for this patient.  Gorden Harms. Wille Aubuchon, PT, DPT [phone]   Khamari Yousuf 04/15/2015, 2:20  PM  [place] MAIN [facility] SERVICES [address] [place], [place], [address] Phone: [phone]   Fax:  [phone]

## 2015-04-17 ENCOUNTER — Ambulatory Visit: Payer: Medicare Other

## 2015-04-22 ENCOUNTER — Ambulatory Visit: Payer: Medicare Other

## 2015-04-22 DIAGNOSIS — M25512 Pain in left shoulder: Principal | ICD-10-CM

## 2015-04-22 DIAGNOSIS — M25511 Pain in right shoulder: Secondary | ICD-10-CM

## 2015-04-22 DIAGNOSIS — M7502 Adhesive capsulitis of left shoulder: Secondary | ICD-10-CM | POA: Diagnosis not present

## 2015-04-22 DIAGNOSIS — M6281 Muscle weakness (generalized): Secondary | ICD-10-CM

## 2015-04-22 NOTE — Patient Instructions (Signed)
[email or web address] Pt given bent over shoulder row, extension 2lbs 3x10 each bilaterally Bent over shoulder horizontal abduction and flexion 0lbs 3x10 each Tband shoulder extensions yellow 3x10 Tband shoulder IR yellow 2x10 bilaterally Isometric ER 5s x 10 bilaterally

## 2015-04-22 NOTE — Therapy (Signed)
[place] MAIN [facility] SERVICES [address] [place], [place], [address] Phone: [phone]   Fax:  [phone]  Physical Therapy Treatment  Patient Details  Name: Jermaine MARSTON Sr. MRN: [id number] Date of Birth: 1929-07-26 Referring Provider:  No ref. provider found  Encounter Date: 04/22/2015      PT End of Session - 04/22/15 1430    Visit Number 5   Number of Visits 8   Date for PT Re-Evaluation 04/29/15   Authorization Type medicare G code 5 of 10   PT Start Time 2122   PT Stop Time 1415   PT Time Calculation (min) 41 min   Activity Tolerance Patient tolerated treatment well;No increased pain   Behavior During Therapy [facility] for tasks assessed/performed      Past Medical History  Diagnosis Date  . Hypertension   . Diabetes mellitus without complication   . Parkinson's disease 2008    Past Surgical History  Procedure Laterality Date  . 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 Pacemaker placement N/A 2016    There were no vitals filed for this visit.  Visit Diagnosis:  Bilateral shoulder pain  Muscle weakness      Subjective Assessment - 04/22/15 1427    Subjective "my shoulders are better, but they aren't well"   Patient Stated Goals maximize shoulder function without surgery   Currently in Pain? Yes   Pain Score 3    Pain Location Shoulder   Pain Orientation Right;Left   Pain Descriptors / Indicators Aching      PT therex:       pt performed the following bilaterally: pullies into flexion/abd x 20 each Low row: yellow band 3x10 Isometric ER 10s x 10 bilaterally  Bent over row, shoulder extension 2lbs 3x10 each bilaterally Bent over shoulder horiz abduction and flexion  3x10 bilaterally- 0lbs Shoulder IR: yellow band 3x10  Pt required min cues for proper exercise performance and to minimize shoulder hike L>R                   PT Short Term Goals - 04/15/15 1414    PT SHORT TERM GOAL #1    Title pt to report resting pain decreased by 2 grades, pain with PROM 3-4/10    Time 2   Period Weeks   Status Partially Met   PT SHORT TERM GOAL #2   Title pt to demonstrate independence with HEP for scapula stabilization as well as [place] RTC active ROM below 90deg   Time 2   Period Weeks   Status Partially Met           PT Long Term Goals - 04/15/15 1415    PT LONG TERM GOAL #1   Title pt to report overall DASH improvement >15pts    Time 4   Period Weeks   Status On-going   PT LONG TERM GOAL #2   Title pt to demonstrate AFE> 120-145 deg with min substitution overhead reach 3/3 trials    Time 4   Period Weeks   Status Partially Met   PT LONG TERM GOAL #3   Title pt will improve AROM UEs so they are able to perform overhead ADLs such as reaching into cabinets   Time 4   Period Weeks   Status Partially Met               Plan - 04/22/15 1430    Clinical  Impression Statement progressed HEP today to reflect progressive scapular / shoulder girdle strengthening program after good performance in session today. pt has minimal pain with exercises, still has signficiant weakness in shoulder ER with significant compensatory patterns R>L suggesting RTC pathology. pt has steadily improved function, however and reports he is able to perform his ADLs with minimal pain. plan to assess for DC next visit. pt edication today concerning his choice to seek orthopedic consult considering pain and level of function.    Pt will benefit from skilled therapeutic intervention in order to improve on the following deficits Decreased range of motion;Postural dysfunction;Pain;Impaired UE functional use;Decreased strength   Rehab Potential Fair   Clinical Impairments Affecting Rehab Potential possible RTC tear   PT Frequency 2x / week   PT Duration 4 weeks   PT Treatment/Interventions Moist Heat;Cryotherapy;Therapeutic activities;Therapeutic exercise;Manual techniques;Passive range of motion         Problem List There are no active problems to display for this patient. Gorden Harms. Bruna Dills, PT, DPT [phone]   Dillard Pascal 04/22/2015, 2:36 PM  [place] MAIN [facility] SERVICES [address] [place], [place], [address] Phone: [phone]   Fax:  [phone]

## 2015-04-24 ENCOUNTER — Ambulatory Visit: Payer: Medicare Other

## 2015-04-24 DIAGNOSIS — M7502 Adhesive capsulitis of left shoulder: Secondary | ICD-10-CM | POA: Diagnosis not present

## 2015-04-24 DIAGNOSIS — M6281 Muscle weakness (generalized): Secondary | ICD-10-CM

## 2015-04-24 DIAGNOSIS — M25511 Pain in right shoulder: Secondary | ICD-10-CM

## 2015-04-24 DIAGNOSIS — M25512 Pain in left shoulder: Principal | ICD-10-CM

## 2015-04-24 NOTE — Therapy (Signed)
[place] MAIN [facility] SERVICES [address] [place], [place], [address] Phone: [phone]   Fax:  [phone]  Physical Therapy Treatment  Patient Details  Name: Jermaine STCYR Sr. MRN: [id number] Date of Birth: 1929-11-16 Referring Provider:  No ref. provider found  Encounter Date: 04/24/2015      PT End of Session - 04/24/15 1601    Visit Number 6   Number of Visits 8   Date for PT Re-Evaluation 04/29/15   Authorization Type medicare G code 6 of 10   PT Start Time 1430   PT Stop Time 1500   PT Time Calculation (min) 30 min   Activity Tolerance Patient tolerated treatment well;No increased pain   Behavior During Therapy [facility] for tasks assessed/performed      Past Medical History  Diagnosis Date  . Hypertension   . Diabetes mellitus without complication   . Parkinson's disease 2008    Past Surgical History  Procedure Laterality Date  . 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 Pacemaker placement N/A 2016    There were no vitals filed for this visit.  Visit Diagnosis:  Bilateral shoulder pain  Muscle weakness      Subjective Assessment - 04/24/15 1559    Subjective " I can do almost everything, but it hurts some"   Patient Stated Goals maximize shoulder function without surgery   Currently in Pain? Yes   Pain Score 3    Pain Location Shoulder   Pain Orientation Left;Right   Pain Descriptors / Indicators Aching        pt performed the following bilaterally: pullies into flexion/abd x 20 each Low row: yellow band 3x10 Isometric ER 10s x 10 bilaterally  Bent over row, shoulder extension 2lbs 3x10 each bilaterally Bent over shoulder horiz abduction and flexion 3x10 bilaterally- 0lbs Shoulder IR: yellow band 3x10  Pt required min cues for proper exercise performance and to minimize shoulder hike L>R Pt requrired min cues and verbal instructions written on HEP to maximize exercise efficiency                            PT Education - 04/24/15 1601    Education Details reviewed HEP and added written cues to HEP to maximize performance    Person(s) Educated Patient   Methods Handout;Explanation   Comprehension Returned demonstration          PT Short Term Goals - 04/24/15 1604    PT SHORT TERM GOAL #1   Title pt to report resting pain decreased by 2 grades, pain with PROM 3-4/10    Time 2   Period Weeks   Status Achieved   PT SHORT TERM GOAL #2   Title pt to demonstrate independence with HEP for scapula stabilization as well as [place] RTC active ROM below 90deg   Time 2   Period Weeks   Status Achieved           PT Long Term Goals - 04/24/15 1604    PT LONG TERM GOAL #1   Baseline 14% disability today on quick dash   Time 4   Period Weeks   Status Achieved   PT LONG TERM GOAL #2   Time 4   Period Weeks   Status Achieved   PT LONG TERM GOAL #3   Time 4   Period Weeks   Status Achieved  Plan - 04-30-2015 1602    Clinical Impression Statement pt has met majority of PT goals and is independent with home strengthening program. pt encouraged to continue this for 3-4 weeks and if pain continues seek ortho consult as a RTC injury is likely. pt has made good progress with pain, ROM and function, however has deficits in RTC strength. pt would benefit from ortho consult, but will be DC at this time as PT skilled services are no longer needed.    Pt will benefit from skilled therapeutic intervention in order to improve on the following deficits Decreased range of motion;Postural dysfunction;Pain;Impaired UE functional use;Decreased strength   Rehab Potential Fair   Clinical Impairments Affecting Rehab Potential possible RTC tear   PT Frequency 2x / week   PT Duration 4 weeks   PT Treatment/Interventions Moist Heat;Cryotherapy;Therapeutic activities;Therapeutic exercise;Manual techniques;Passive range of motion           G-Codes - Apr 30, 2015 1605    Functional Limitation Carrying, moving and handling objects   Carrying, Moving and Handling Objects Current Status [phone]) At least 1 percent but less than 20 percent impaired, limited or restricted   Carrying, Moving and Handling Objects Goal Status ([id number]) At least 1 percent but less than 20 percent impaired, limited or restricted   Carrying, Moving and Handling Objects Discharge Status [id number]) At least 1 percent but less than 20 percent impaired, limited or restricted     PHYSICAL THERAPY DISCHARGE SUMMARY  Visits from Start of Care:5  Current functional level related to goals / functional outcomes: Pt is able to preform most ADLs, but still has pain, although less   Remaining deficits: Strength deficit, pain, possible RTC pathology bilaterally    Education / Equipment: Written HEP Plan: Patient agrees to discharge.  Patient goals were met. Patient is being discharged due to meeting the stated rehab goals.  ?????       Problem List There are no active problems to display for this patient. Gorden Harms. Tejon Gracie, PT, DPT [phone]   Mateusz Neilan 04/30/15, 4:06 PM  [place] MAIN [facility] SERVICES [address] [place], [place], [address] Phone: [phone]   Fax:  [phone]

## 2015-04-29 ENCOUNTER — Ambulatory Visit: Payer: Medicare Other

## 2015-05-01 ENCOUNTER — Ambulatory Visit: Payer: Medicare Other

## 2015-12-17 ENCOUNTER — Emergency Department: Payer: Medicare Other

## 2015-12-17 ENCOUNTER — Emergency Department
Admission: EM | Admit: 2015-12-17 | Discharge: 2015-12-18 | Disposition: A | Discharge status: Home / Self Care | Payer: Medicare Other | Attending: Emergency Medicine | Admitting: Emergency Medicine

## 2015-12-17 ENCOUNTER — Encounter: Payer: Self-pay | Admitting: Urgent Care

## 2015-12-17 DIAGNOSIS — Z79899 Other long term (current) drug therapy: Secondary | ICD-10-CM | POA: Diagnosis not present

## 2015-12-17 DIAGNOSIS — E119 Type 2 diabetes mellitus without complications: Secondary | ICD-10-CM | POA: Insufficient documentation

## 2015-12-17 DIAGNOSIS — I509 Heart failure, unspecified: Secondary | ICD-10-CM | POA: Insufficient documentation

## 2015-12-17 DIAGNOSIS — R0602 Shortness of breath: Secondary | ICD-10-CM

## 2015-12-17 DIAGNOSIS — I10 Essential (primary) hypertension: Secondary | ICD-10-CM | POA: Insufficient documentation

## 2015-12-17 HISTORY — DX: Heart failure, unspecified: I50.9

## 2015-12-17 HISTORY — DX: Cardiomegaly: I51.7

## 2015-12-17 HISTORY — DX: Obstructive sleep apnea (adult) (pediatric): G47.33

## 2015-12-17 LAB — BASIC METABOLIC PANEL
ANION GAP: 7 (ref 5–15)
BUN: 18 mg/dL (ref 6–20)
CO2: 29 mmol/L (ref 22–32)
Calcium: 9.3 mg/dL (ref 8.9–10.3)
Chloride: 107 mmol/L (ref 101–111)
Creatinine, Ser: 0.76 mg/dL (ref 0.61–1.24)
Glucose, Bld: 109 mg/dL — ABNORMAL HIGH (ref 65–99)
POTASSIUM: 3.9 mmol/L (ref 3.5–5.1)
SODIUM: 143 mmol/L (ref 135–145)

## 2015-12-17 LAB — CBC
HEMATOCRIT: 42.9 % (ref 40.0–52.0)
HEMOGLOBIN: 14.2 g/dL (ref 13.0–18.0)
MCH: 28.7 pg (ref 26.0–34.0)
MCHC: 33.1 g/dL (ref 32.0–36.0)
MCV: 86.7 fL (ref 80.0–100.0)
Platelets: 134 10*3/uL — ABNORMAL LOW (ref 150–440)
RBC: 4.95 MIL/uL (ref 4.40–5.90)
RDW: 14.9 % — ABNORMAL HIGH (ref 11.5–14.5)
WBC: 7.9 10*3/uL (ref 3.8–10.6)

## 2015-12-17 LAB — TROPONIN I
TROPONIN I: 0.05 ng/mL — AB (ref <0.031)
Troponin I: 0.05 ng/mL — ABNORMAL HIGH (ref <0.031)

## 2015-12-17 LAB — BRAIN NATRIURETIC PEPTIDE: B NATRIURETIC PEPTIDE 5: 964 pg/mL — AB (ref 0.0–100.0)

## 2015-12-17 MED ORDER — FUROSEMIDE 10 MG/ML IJ SOLN
40.0000 mg | Freq: Once | INTRAMUSCULAR | Status: AC
  Administered 2015-12-17: 40 mg via INTRAVENOUS
  Filled 2015-12-17: qty 4

## 2015-12-17 NOTE — Discharge Instructions (Signed)
Please take the medication prescribed by your primary care doctor at today's visit. Please seek medical attention for any high fevers, chest pain, shortness of breath, change in behavior, persistent vomiting, bloody stool or any other new or concerning symptoms.   Heart Failure Heart failure is a condition in which the heart has trouble pumping blood. This means your heart does not pump blood efficiently for your body to work well. In some cases of heart failure, fluid may back up into your lungs or you may have swelling (edema) in your lower legs. Heart failure is usually a long-term (chronic) condition. It is important for you to take good care of yourself and follow your health care provider's treatment plan. CAUSES  Some health conditions can cause heart failure. Those health conditions include:  High blood pressure (hypertension). Hypertension causes the heart muscle to work harder than normal. When pressure in the blood vessels is high, the heart needs to pump (contract) with more force in order to circulate blood throughout the body. High blood pressure eventually causes the heart to become stiff and weak.  Coronary artery disease (CAD). CAD is the buildup of cholesterol and fat (plaque) in the arteries of the heart. The blockage in the arteries deprives the heart muscle of oxygen and blood. This can cause chest pain and may lead to a heart attack. High blood pressure can also contribute to CAD.  Heart attack (myocardial infarction). A heart attack occurs when one or more arteries in the heart become blocked. The loss of oxygen damages the muscle tissue of the heart. When this happens, part of the heart muscle dies. The injured tissue does not contract as well and weakens the heart's ability to pump blood.  Abnormal heart valves. When the heart valves do not open and close properly, it can cause heart failure. This makes the heart muscle pump harder to keep the blood flowing.  Heart muscle  disease (cardiomyopathy or myocarditis). Heart muscle disease is damage to the heart muscle from a variety of causes. These can include drug or alcohol abuse, infections, or unknown reasons. These can increase the risk of heart failure.  Lung disease. Lung disease makes the heart work harder because the lungs do not work properly. This can cause a strain on the heart, leading it to fail.  Diabetes. Diabetes increases the risk of heart failure. High blood sugar contributes to high fat (lipid) levels in the blood. Diabetes can also cause slow damage to tiny blood vessels that carry important nutrients to the heart muscle. When the heart does not get enough oxygen and food, it can cause the heart to become weak and stiff. This leads to a heart that does not contract efficiently.  Other conditions can contribute to heart failure. These include abnormal heart rhythms, thyroid problems, and low blood counts (anemia). Certain unhealthy behaviors can increase the risk of heart failure, including:  Being overweight.  Smoking or chewing tobacco.  Eating foods high in fat and cholesterol.  Abusing illicit drugs or alcohol.  Lacking physical activity. SYMPTOMS  Heart failure symptoms may vary and can be hard to detect. Symptoms may include:  Shortness of breath with activity, such as climbing stairs.  Persistent cough.  Swelling of the feet, ankles, legs, or abdomen.  Unexplained weight gain.  Difficulty breathing when lying flat (orthopnea).  Waking from sleep because of the need to sit up and get more air.  Rapid heartbeat.  Fatigue and loss of energy.  Feeling light-headed, dizzy, or  close to fainting.  Loss of appetite.  Nausea.  Increased urination during the night (nocturia). DIAGNOSIS  A diagnosis of heart failure is based on your history, symptoms, physical examination, and diagnostic tests. Diagnostic tests for heart failure may  include:  Echocardiography.  Electrocardiography.  Chest X-ray.  Blood tests.  Exercise stress test.  Cardiac angiography.  Radionuclide scans. TREATMENT  Treatment is aimed at managing the symptoms of heart failure. Medicines, behavioral changes, or surgical intervention may be necessary to treat heart failure.  Medicines to help treat heart failure may include:  Angiotensin-converting enzyme (ACE) inhibitors. This type of medicine blocks the effects of a blood protein called angiotensin-converting enzyme. ACE inhibitors relax (dilate) the blood vessels and help lower blood pressure.  Angiotensin receptor blockers (ARBs). This type of medicine blocks the actions of a blood protein called angiotensin. Angiotensin receptor blockers dilate the blood vessels and help lower blood pressure.  Water pills (diuretics). Diuretics cause the kidneys to remove salt and water from the blood. The extra fluid is removed through urination. This loss of extra fluid lowers the volume of blood the heart pumps.  Beta blockers. These prevent the heart from beating too fast and improve heart muscle strength.  Digitalis. This increases the force of the heartbeat.  Healthy behavior changes include:  Obtaining and maintaining a healthy weight.  Stopping smoking or chewing tobacco.  Eating heart-healthy foods.  Limiting or avoiding alcohol.  Stopping illicit drug use.  Physical activity as directed by your health care provider.  Surgical treatment for heart failure may include:  A procedure to open blocked arteries, repair damaged heart valves, or remove damaged heart muscle tissue.  A pacemaker to improve heart muscle function and control certain abnormal heart rhythms.  An internal cardioverter defibrillator to treat certain serious abnormal heart rhythms.  A left ventricular assist device (LVAD) to assist the pumping ability of the heart. HOME CARE INSTRUCTIONS   Take medicines only  as directed by your health care provider. Medicines are important in reducing the workload of your heart, slowing the progression of heart failure, and improving your symptoms.  Do not stop taking your medicine unless directed by your health care provider.  Do not skip any dose of medicine.  Refill your prescriptions before you run out of medicine. Your medicines are needed every day.  Engage in moderate physical activity if directed by your health care provider. Moderate physical activity can benefit some people. The elderly and people with severe heart failure should consult with a health care provider for physical activity recommendations.  Eat heart-healthy foods. Food choices should be free of trans fat and low in saturated fat, cholesterol, and salt (sodium). Healthy choices include fresh or frozen fruits and vegetables, fish, lean meats, legumes, fat-free or low-fat dairy products, and whole grain or high fiber foods. Talk to a dietitian to learn more about heart-healthy foods.  Limit sodium if directed by your health care provider. Sodium restriction may reduce symptoms of heart failure in some people. Talk to a dietitian to learn more about heart-healthy seasonings.  Use healthy cooking methods. Healthy cooking methods include roasting, grilling, broiling, baking, poaching, steaming, or stir-frying. Talk to a dietitian to learn more about healthy cooking methods.  Limit fluids if directed by your health care provider. Fluid restriction may reduce symptoms of heart failure in some people.  Weigh yourself every day. Daily weights are important in the early recognition of excess fluid. You should weigh yourself every morning after you urinate  and before you eat breakfast. Wear the same amount of clothing each time you weigh yourself. Record your daily weight. Provide your health care provider with your weight record.  Monitor and record your blood pressure if directed by your health care  provider.  Check your pulse if directed by your health care provider.  Lose weight if directed by your health care provider. Weight loss may reduce symptoms of heart failure in some people.  Stop smoking or chewing tobacco. Nicotine makes your heart work harder by causing your blood vessels to constrict. Do not use nicotine gum or patches before talking to your health care provider.  Keep all follow-up visits as directed by your health care provider. This is important.  Limit alcohol intake to no more than 1 drink per day for nonpregnant women and 2 drinks per day for men. One drink equals 12 ounces of beer, 5 ounces of wine, or 1 ounces of hard liquor. Drinking more than that is harmful to your heart. Tell your health care provider if you drink alcohol several times a week. Talk with your health care provider about whether alcohol is safe for you. If your heart has already been damaged by alcohol or you have severe heart failure, drinking alcohol should be stopped completely.  Stop illicit drug use.  Stay up-to-date with immunizations. It is especially important to prevent respiratory infections through current pneumococcal and influenza immunizations.  Manage other health conditions such as hypertension, diabetes, thyroid disease, or abnormal heart rhythms as directed by your health care provider.  Learn to manage stress.  Plan rest periods when fatigued.  Learn strategies to manage high temperatures. If the weather is extremely hot:  Avoid vigorous physical activity.  Use air conditioning or fans or seek a cooler location.  Avoid caffeine and alcohol.  Wear loose-fitting, lightweight, and light-colored clothing.  Learn strategies to manage cold temperatures. If the weather is extremely cold:  Avoid vigorous physical activity.  Layer clothes.  Wear mittens or gloves, a hat, and a scarf when going outside.  Avoid alcohol.  Obtain ongoing education and support as  needed.  Participate in or seek rehabilitation as needed to maintain or improve independence and quality of life. SEEK MEDICAL CARE IF:   You have a rapid weight gain.  You have increasing shortness of breath that is unusual for you.  You are unable to participate in your usual physical activities.  You tire easily.  You cough more than normal, especially with physical activity.  You have any or more swelling in areas such as your hands, feet, ankles, or abdomen.  You are unable to sleep because it is hard to breathe.  You feel like your heart is beating fast (palpitations).  You become dizzy or light-headed upon standing up. SEEK IMMEDIATE MEDICAL CARE IF:   You have difficulty breathing.  There is a change in mental status such as decreased alertness or difficulty with concentration.  You have a pain or discomfort in your chest.  You have an episode of fainting (syncope). MAKE SURE YOU:   Understand these instructions.  Will watch your condition.  Will get help right away if you are not doing well or get worse.   This information is not intended to replace advice given to you by your health care provider. Make sure you discuss any questions you have with your health care provider.   Document Released: 11/29/2005 Document Revised: 04/15/2015 Document Reviewed: 12/29/2012 Elsevier Interactive Patient Education [employer].

## 2015-12-17 NOTE — ED Notes (Signed)
Patient presents with c/o SOB since December. Patient has been seen by his PCP. SOB has worsened over the last 3 days. Patient denies cough, fever, and chest pain. Of note, patient was seen by Mel Almondumey, PA today and had Lasix changed to Demadex. Patient presents with family - advises that they "didnt believe a pill was what he needed" due to the degree of SOB noted at home.

## 2015-12-17 NOTE — ED Notes (Signed)
Critical Troponin result called to nursing staff; result 0.05. ED charge nurse made aware.

## 2015-12-17 NOTE — ED Provider Notes (Signed)
[facility] Emergency Department Provider Note    ____________________________________________  Time seen: 2150  I have reviewed the triage vital signs and the nursing notes.   HISTORY  Chief Complaint Shortness of Breath   History limited by: Not Limited   HPI Jermaine [name]. is a 82 y.o. male  With history of CHF who presents to the emergency department today because of concerns for shortness breath. Patient states the shortness breath is getting progressively worse. It started 2-3 days ago. It has been constant. He does have associated orthopnea. The patient saw his primary care provider today and was switched on what fluid pill he was on. The patient states that he does not think that this will work. He denies any chest pain. No fevers.     Past Medical History  Diagnosis Date  . Hypertension   . Diabetes mellitus without complication (HCC)   . Parkinson's disease (HCC) 2008  . CHF (congestive heart failure) (HCC)   . LVH (left ventricular hypertrophy)   . OSA (obstructive sleep apnea)     There are no active problems to display for this patient.   Past Surgical History  Procedure Laterality Date  . 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 Pacemaker placement N/A 2016    Current Outpatient Rx  Name  Route  Sig  Dispense  Refill  . allopurinol (ZYLOPRIM) 300 MG tablet   Oral   Take 300 mg by mouth daily.         Marland Kitchen amLODipine (NORVASC) 10 MG tablet   Oral   Take 10 mg by mouth daily.         . carbidopa-levodopa (PARCOPA) 25-250 MG per disintegrating tablet   Oral   Take 2 tablets by mouth 3 (three) times daily.         . cholecalciferol (VITAMIN D) 1000 UNITS tablet   Oral   Take 1,000 Units by mouth daily.         Marland Kitchen docusate sodium (COLACE) 100 MG capsule   Oral   Take 100 mg by mouth 2 (two) times daily.         Marland Kitchen doxazosin (CARDURA) 8 MG tablet   Oral   Take 8 mg by mouth daily.         Marland Kitchen  lisinopril-hydrochlorothiazide (PRINZIDE,ZESTORETIC) 20-12.5 MG per tablet   Oral   Take 1 tablet by mouth daily.         . potassium chloride SA (K-DUR,KLOR-CON) 20 MEQ tablet   Oral   Take 20 mEq by mouth 2 (two) times daily.         . pravastatin (PRAVACHOL) 20 MG tablet   Oral   Take 20 mg by mouth daily.         . pseudoephedrine-acetaminophen (TYLENOL SINUS) 30-500 MG TABS   Oral   Take 1 tablet by mouth every 4 (four) hours as needed.         Marland Kitchen rOPINIRole (REQUIP) 1 MG tablet   Oral   Take 1 mg by mouth 2 (two) times daily at 10 AM and 5 PM.           Allergies Review of patient's allergies indicates no known allergies.  No family history on file.  Social History Social History  Substance Use Topics  . Smoking status: Never Smoker   . Smokeless tobacco: None  . Alcohol Use: No    Review of Systems  Constitutional:  Negative for fever. Cardiovascular: Negative for chest pain. Respiratory: Negative for shortness of breath. Gastrointestinal: Negative for abdominal pain, vomiting and diarrhea. Genitourinary: Negative for dysuria. Musculoskeletal: Negative for back pain. Skin: Negative for rash. Neurological: Negative for headaches, focal weakness or numbness.   10-point ROS otherwise negative.  ____________________________________________   PHYSICAL EXAM:  VITAL SIGNS: ED Triage Vitals  Enc Vitals Group     BP 12/17/15 1918 174/98 mmHg     Pulse Rate 12/17/15 1918 69     Resp --      Temp 12/17/15 1918 97.6 F (36.4 C)     Temp Source 12/17/15 1918 Oral     SpO2 12/17/15 1918 92 %     Weight 12/17/15 1918 239 lb (108.41 kg)     Height 12/17/15 1918 5\' 8"  (1.727 m)     Head Cir --      Peak Flow --      Pain Score 12/17/15 1940 0   Constitutional: Alert and oriented. Well appearing and in no distress. Eyes: Conjunctivae are normal. PERRL. Normal extraocular movements. ENT   Head: Normocephalic and atraumatic.   Nose: No  congestion/rhinnorhea.   Mouth/Throat: Mucous membranes are moist.   Neck: No stridor. Hematological/Lymphatic/Immunilogical: No cervical lymphadenopathy. Cardiovascular: Normal rate, regular rhythm.  No murmurs, rubs, or gallops. Respiratory: Normal respiratory effort without tachypnea nor retractions. Breath sounds are clear and equal bilaterally. No wheezes/rales/rhonchi. Gastrointestinal: Soft and nontender. No distention. There is no CVA tenderness. Genitourinary: Deferred Musculoskeletal: Normal range of motion in all extremities. No joint effusions.  No lower extremity tenderness nor edema. Neurologic:  Normal speech and language. No gross focal neurologic deficits are appreciated.  Skin:  Skin is warm, dry and intact. No rash noted. Psychiatric: Mood and affect are normal. Speech and behavior are normal. Patient exhibits appropriate insight and judgment.  ____________________________________________    LABS (pertinent positives/negatives)  Labs Reviewed  BASIC METABOLIC PANEL - Abnormal; Notable for the following:    Glucose, Bld 109 (*)    All other components within normal limits  CBC - Abnormal; Notable for the following:    RDW 14.9 (*)    Platelets 134 (*)    All other components within normal limits  TROPONIN I - Abnormal; Notable for the following:    Troponin I 0.05 (*)    All other components within normal limits  BRAIN NATRIURETIC PEPTIDE - Abnormal; Notable for the following:    B Natriuretic Peptide 964.0 (*)    All other components within normal limits  TROPONIN I     ____________________________________________   EKG  I, Phineas SemenGraydon Leonetta Mcgivern, attending physician, personally viewed and interpreted this EKG  EKG Time: 1925 Rate: 74 Rhythm: demand pacemaker Axis: left axis deviation Intervals: qtc 559 QRS: wide, paced rhythm ST changes: no st elevation equivalent Impression: abnormal ekg ____________________________________________     RADIOLOGY  CXR IMPRESSION: Evidence a degree of congestive heart failure. No airspace consolidation.   ____________________________________________   PROCEDURES  Procedure(s) performed: None  Critical Care performed: No  ____________________________________________   INITIAL IMPRESSION / ASSESSMENT AND PLAN / ED COURSE  Pertinent labs & imaging results that were available during my care of the patient were reviewed by me and considered in my medical decision making (see chart for details).  Patient presented to the emergency department today because of concerns for shortness of breath. Patient did see primary care providers earlier today and was switched on what fluid pill he was on. He had some concerns  that this might not be enough. The patient was given IV Lasix here. Additionally the patient had an initial elevation of the troponin. This was repeated and did not change. At this point I think the elevated troponin is likely secondary to the heart failure. Patient did have multiple episodes of urination after the IV Lasix. I feel the patient can be treated as an outpatient per her primary care lamp. I will give patient the heart failure clinic number. Did also instruct patient follow up with cardiology.  ____________________________________________   FINAL CLINICAL IMPRESSION(S) / ED DIAGNOSES  Final diagnoses:  Acute on chronic congestive heart failure, unspecified congestive heart failure type (HCC)  Shortness of breath     Phineas Semen, MD 12/18/15 0022

## 2015-12-17 NOTE — ED Notes (Signed)
Pt placed on 3L due to low oxygen saturation.

## 2015-12-18 NOTE — ED Notes (Signed)
[facility] instructed on follow up plan, no RX provided. Denies pain Pt reports feeling a lot better. PT NAD AT DC

## 2015-12-25 ENCOUNTER — Encounter: Payer: Self-pay | Admitting: Family

## 2015-12-25 ENCOUNTER — Ambulatory Visit: Payer: Medicare Other | Attending: Family | Admitting: Family

## 2015-12-25 VITALS — BP 104/60 | HR 75 | Resp 20 | Ht 68.0 in | Wt 228.0 lb

## 2015-12-25 DIAGNOSIS — I120 Hypertensive chronic kidney disease with stage 5 chronic kidney disease or end stage renal disease: Secondary | ICD-10-CM | POA: Diagnosis not present

## 2015-12-25 DIAGNOSIS — I10 Essential (primary) hypertension: Secondary | ICD-10-CM

## 2015-12-25 DIAGNOSIS — G4733 Obstructive sleep apnea (adult) (pediatric): Secondary | ICD-10-CM | POA: Diagnosis not present

## 2015-12-25 DIAGNOSIS — Z7982 Long term (current) use of aspirin: Secondary | ICD-10-CM | POA: Insufficient documentation

## 2015-12-25 DIAGNOSIS — E119 Type 2 diabetes mellitus without complications: Secondary | ICD-10-CM | POA: Diagnosis not present

## 2015-12-25 DIAGNOSIS — I5022 Chronic systolic (congestive) heart failure: Secondary | ICD-10-CM | POA: Diagnosis not present

## 2015-12-25 DIAGNOSIS — G20 Parkinson's disease: Secondary | ICD-10-CM | POA: Diagnosis not present

## 2015-12-25 DIAGNOSIS — F32A Depression, unspecified: Secondary | ICD-10-CM

## 2015-12-25 DIAGNOSIS — Z79899 Other long term (current) drug therapy: Secondary | ICD-10-CM | POA: Insufficient documentation

## 2015-12-25 DIAGNOSIS — G20A1 Parkinson's disease without dyskinesia, without mention of fluctuations: Secondary | ICD-10-CM | POA: Insufficient documentation

## 2015-12-25 DIAGNOSIS — F329 Major depressive disorder, single episode, unspecified: Secondary | ICD-10-CM | POA: Diagnosis not present

## 2015-12-25 NOTE — Progress Notes (Signed)
Subjective:    Patient ID: Jermaine [name]., male    DOB: [birth year], 82 y.o.   MRN: [id number]  Congestive Heart Failure Presents for initial visit. The disease course has been improving. Associated symptoms include fatigue. Pertinent negatives include no abdominal pain, chest pain, edema, orthopnea, palpitations or shortness of breath. The symptoms have been improving. Past treatments include salt and fluid restriction, beta blockers and ACE inhibitors. The treatment provided moderate relief. Compliance with prior treatments has been good. His past medical history is significant for DM and HTN. There is no history of CVA. Compliance with total regimen is 76-100%.  Hypertension This is a chronic problem. The current episode started more than 1 year ago. The problem is unchanged. The problem is controlled. Associated symptoms include malaise/fatigue. Pertinent negatives include no chest pain, headaches, neck pain, palpitations, peripheral edema or shortness of breath. There are no associated agents to hypertension. Risk factors for coronary artery disease include diabetes mellitus, male gender and sedentary lifestyle. Past treatments include diuretics, lifestyle changes, beta blockers and ACE inhibitors. The current treatment provides moderate improvement. There are no compliance problems.  Hypertensive end-organ damage includes heart failure.    Past Medical History  Diagnosis Date  . Hypertension   . Diabetes mellitus without complication (HCC)   . Parkinson's disease (HCC) 2008  . CHF (congestive heart failure) (HCC)   . LVH (left ventricular hypertrophy)   . OSA (obstructive sleep apnea)    Past Surgical History  Procedure Laterality Date  . 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 Pacemaker placement N/A 2016    History reviewed. No pertinent family history.  Social History  Substance Use Topics  . Smoking status: Never Smoker   . Smokeless tobacco: Never Used  .  Alcohol Use: No    No Known Allergies  Prior to Admission medications   Medication Sig Start Date End Date Taking? Authorizing Provider  allopurinol (ZYLOPRIM) 300 MG tablet Take 150 mg by mouth daily.    Yes Historical Provider, MD  aspirin 325 MG tablet Take 325 mg by mouth at bedtime.   Yes Historical Provider, MD  carbidopa-levodopa (PARCOPA) 25-250 MG per disintegrating tablet Take 2 tablets by mouth 3 (three) times daily.   Yes Historical Provider, MD  carvedilol (COREG) 3.125 MG tablet Take 3.125 mg by mouth 2 (two) times daily with a meal.   Yes Historical Provider, MD  cholecalciferol (VITAMIN D) 1000 UNITS tablet Take 1,000 Units by mouth daily.   Yes Historical Provider, MD  doxazosin (CARDURA) 8 MG tablet Take 4 mg by mouth daily.    Yes Historical Provider, MD  lisinopril (PRINIVIL,ZESTRIL) 20 MG tablet Take 20 mg by mouth daily.   Yes Historical Provider, MD  Melatonin 5 MG TABS Take 1 tablet by mouth at bedtime.   Yes Historical Provider, MD  potassium chloride SA (K-DUR,KLOR-CON) 20 MEQ tablet Take 20 mEq by mouth daily.    Yes Historical Provider, MD  pravastatin (PRAVACHOL) 20 MG tablet Take 20 mg by mouth at bedtime.    Yes Historical Provider, MD  rOPINIRole (REQUIP) 1 MG tablet Take 1 mg by mouth 2 (two) times daily at 10 AM and 5 PM.   Yes Historical Provider, MD  torsemide (DEMADEX) 20 MG tablet Take 20 mg by mouth daily.   Yes Historical Provider, MD     Review of Systems  Constitutional: Positive for malaise/fatigue, appetite change (get full easy) and fatigue.  HENT:  Positive for rhinorrhea. Negative for congestion and sore throat.   Eyes: Negative.   Respiratory: Negative for cough, chest tightness and shortness of breath.   Cardiovascular: Negative for chest pain, palpitations and leg swelling.  Gastrointestinal: Negative for abdominal pain and abdominal distention.  Endocrine: Negative.   Musculoskeletal: Positive for back pain (intermittent). Negative for  neck pain.  Skin: Negative.   Allergic/Immunologic: Negative.   Neurological: Positive for light-headedness. Negative for dizziness and headaches.  Hematological: Negative for adenopathy. Bruises/bleeds easily.  Psychiatric/Behavioral: Positive for dysphoric mood (wife recently died Nov 2016). Negative for sleep disturbance (sleeping on 1-2 pillows with CPAP). The patient is not nervous/anxious.        Objective:   Physical Exam  Constitutional: He is oriented to person, place, and time. He appears well-developed and well-nourished.  HENT:  Head: Normocephalic and atraumatic.  Eyes: Conjunctivae are normal. Pupils are equal, round, and reactive to light.  Neck: Normal range of motion. Neck supple.  Cardiovascular: Normal rate and regular rhythm.   Pulmonary/Chest: Effort normal. He has no wheezes. He has no rales.  Abdominal: Soft. He exhibits no distension. There is no tenderness.  Musculoskeletal: He exhibits no edema or tenderness.  Neurological: He is alert and oriented to person, place, and time.  Skin: Skin is warm and dry.  Psychiatric: He has a normal mood and affect. His behavior is normal. Thought content normal.  Nursing note and vitals reviewed.   BP 104/60 mmHg  Pulse 75  Resp 20  Ht 5\' 8"  (1.727 m)  Wt 228 lb (103.42 kg)  BMI 34.68 kg/m2  SpO2 94%       Assessment & Plan:  1: Chronic heart failure with reduced ejection fraction- Patient presents after an ER visit last week with quite a bit of fatigue with little exertion. Doesn't endorse any shortness of breath or edema. He was short of breath last week but says that the torsemide has helped quite a bit. Was wondering if he needed to take it daily due to having to urinate so much. Discussed that he could try taking it every other day and if needed to, he could take it daily if he was short of breath, any edema or weight gain. He is already weighing himself daily and he was instructed to call for an overnight weight  gain of >2 pounds or a weekly weight gain of >5 pounds. He does add salt to his food and tends to eat easily prepared foods now that he's living along. Discussed the importance of following a 2000mg  sodium diet and to not add any salt to his food especially since he's eating more processed items. Can use pepper or Mrs. Dash. Written dietary information was given to him as well. Hasn't been exercising due to his parkinson's but is interested in getting back to exercising. A brochure was given to him regarding cardiac rehab for his review. Has already received his flu vaccine for this year.Could consider increasing his carvedilol dose and/or changing to entresto. 2: HTN- Blood pressure looks great today. Continue medications. 3: Obstructive sleep apnea- He does have a cpap machine that he wears inconsistently. His last sleep study was done about 8 years ago and he feels like the equipment isn't fitting correctly anymore. Discussed that he probably needed a new sleep study since it's been >5 years since his last one and he is agreeable. A referral was sent to [facility] for them to contact patient. 4: Depression- Patient does admit to some  depression as his wife passed away 2 months ago in 10/29/2015& he's trying to learn to adjust to living alone. Doesn't feel like the depression is bad and the daughter that is with him feels like patient is coping as well as expected. Emotional support offered. Cardiac rehab would help socially as well.   Return here in 1 month or sooner for any questions/problems before then.

## 2015-12-25 NOTE — Patient Instructions (Signed)
Continue weighing daily and call for an overnight weight gain of > 2 pounds or a weekly weight gain of >5 pounds. 

## 2016-01-14 ENCOUNTER — Ambulatory Visit: Payer: Medicare Other | Attending: Specialist

## 2016-01-15 ENCOUNTER — Ambulatory Visit: Payer: Medicare Other | Attending: Specialist

## 2016-01-15 DIAGNOSIS — I10 Essential (primary) hypertension: Secondary | ICD-10-CM | POA: Insufficient documentation

## 2016-01-15 DIAGNOSIS — G4710 Hypersomnia, unspecified: Secondary | ICD-10-CM | POA: Diagnosis present

## 2016-01-15 DIAGNOSIS — G4733 Obstructive sleep apnea (adult) (pediatric): Secondary | ICD-10-CM | POA: Diagnosis not present

## 2016-01-22 ENCOUNTER — Ambulatory Visit: Payer: Medicare Other | Attending: Family | Admitting: Family

## 2016-01-22 ENCOUNTER — Encounter: Payer: Self-pay | Admitting: Family

## 2016-01-22 VITALS — BP 129/64 | HR 69 | Resp 18 | Ht 68.0 in | Wt 233.0 lb

## 2016-01-22 DIAGNOSIS — G20 Parkinson's disease: Secondary | ICD-10-CM | POA: Diagnosis not present

## 2016-01-22 DIAGNOSIS — E669 Obesity, unspecified: Secondary | ICD-10-CM | POA: Insufficient documentation

## 2016-01-22 DIAGNOSIS — Z79899 Other long term (current) drug therapy: Secondary | ICD-10-CM | POA: Diagnosis not present

## 2016-01-22 DIAGNOSIS — Z7982 Long term (current) use of aspirin: Secondary | ICD-10-CM | POA: Diagnosis not present

## 2016-01-22 DIAGNOSIS — R5383 Other fatigue: Secondary | ICD-10-CM | POA: Insufficient documentation

## 2016-01-22 DIAGNOSIS — Z6835 Body mass index (BMI) 35.0-35.9, adult: Secondary | ICD-10-CM | POA: Insufficient documentation

## 2016-01-22 DIAGNOSIS — E119 Type 2 diabetes mellitus without complications: Secondary | ICD-10-CM | POA: Insufficient documentation

## 2016-01-22 DIAGNOSIS — R0602 Shortness of breath: Secondary | ICD-10-CM | POA: Insufficient documentation

## 2016-01-22 DIAGNOSIS — F32A Depression, unspecified: Secondary | ICD-10-CM

## 2016-01-22 DIAGNOSIS — G4733 Obstructive sleep apnea (adult) (pediatric): Secondary | ICD-10-CM

## 2016-01-22 DIAGNOSIS — I10 Essential (primary) hypertension: Secondary | ICD-10-CM

## 2016-01-22 DIAGNOSIS — F329 Major depressive disorder, single episode, unspecified: Secondary | ICD-10-CM | POA: Insufficient documentation

## 2016-01-22 DIAGNOSIS — I5022 Chronic systolic (congestive) heart failure: Secondary | ICD-10-CM

## 2016-01-22 NOTE — Progress Notes (Signed)
Subjective:    Patient ID: Jermaine [name]., male    DOB: 1929/04/08, 81 y.o.   MRN: [id number]  Congestive Heart Failure Presents for follow-up visit. The disease course has been stable. Associated symptoms include fatigue and shortness of breath. Pertinent negatives include no abdominal pain, chest pain, edema, orthopnea or palpitations. The symptoms have been stable. Past treatments include ACE inhibitors, beta blockers and salt and fluid restriction. The treatment provided moderate relief. Compliance with prior treatments has been good. His past medical history is significant for DM and HTN. Family history of heart disease: negative history. Compliance with total regimen is 76-100%. Compliance problems include adherence to exercise.   Hypertension This is a chronic problem. The current episode started more than 1 year ago. The problem is unchanged. The problem is controlled. Associated symptoms include shortness of breath. Pertinent negatives include no chest pain, headaches, neck pain, palpitations or peripheral edema. There are no associated agents to hypertension. Risk factors for coronary artery disease include diabetes mellitus, male gender and obesity. Past treatments include ACE inhibitors, beta blockers, diuretics and lifestyle changes. The current treatment provides moderate improvement. Compliance problems include exercise.  Hypertensive end-organ damage includes heart failure.   Past Medical History  Diagnosis Date  . Hypertension   . Diabetes mellitus without complication (HCC)   . Parkinson's disease (HCC) 2008  . CHF (congestive heart failure) (HCC)   . LVH (left ventricular hypertrophy)   . OSA (obstructive sleep apnea)     Past Surgical History  Procedure Laterality Date  . 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 Pacemaker placement N/A 2016    Family History  Problem Relation Age of Onset  . Anemia Neg Hx   . Arrhythmia Neg Hx   . Asthma Neg Hx   .  Clotting disorder Neg Hx   . Fainting Neg Hx   . Heart attack Neg Hx   . Heart disease Neg Hx   . Heart failure Neg Hx   . Hyperlipidemia Neg Hx   . Hypertension Neg Hx     Social History  Substance Use Topics  . Smoking status: Never Smoker   . Smokeless tobacco: Never Used  . Alcohol Use: No    No Known Allergies  Prior to Admission medications   Medication Sig Start Date End Date Taking? Authorizing Provider  allopurinol (ZYLOPRIM) 300 MG tablet Take 150 mg by mouth daily.    Yes Historical Provider, MD  aspirin 325 MG tablet Take 325 mg by mouth at bedtime.   Yes Historical Provider, MD  carbidopa-levodopa (PARCOPA) 25-250 MG per disintegrating tablet Take 2 tablets by mouth 3 (three) times daily.   Yes Historical Provider, MD  carvedilol (COREG) 3.125 MG tablet Take 3.125 mg by mouth 2 (two) times daily with a meal.   Yes Historical Provider, MD  cholecalciferol (VITAMIN D) 1000 UNITS tablet Take 1,000 Units by mouth daily.   Yes Historical Provider, MD  doxazosin (CARDURA) 8 MG tablet Take 4 mg by mouth daily.    Yes Historical Provider, MD  lisinopril (PRINIVIL,ZESTRIL) 20 MG tablet Take 20 mg by mouth daily.   Yes Historical Provider, MD  Melatonin 5 MG TABS Take 1 tablet by mouth at bedtime.   Yes Historical Provider, MD  potassium chloride SA (K-DUR,KLOR-CON) 20 MEQ tablet Take 20 mEq by mouth daily.    Yes Historical Provider, MD  pravastatin (PRAVACHOL) 20 MG tablet Take 20 mg by mouth at  bedtime.    Yes Historical Provider, MD  rOPINIRole (REQUIP) 1 MG tablet Take 1 mg by mouth 2 (two) times daily at 10 AM and 5 PM.   Yes Historical Provider, MD  torsemide (DEMADEX) 20 MG tablet Take 10 mg by mouth daily.    Yes Historical Provider, MD       Review of Systems  Constitutional: Positive for fatigue. Negative for appetite change.  HENT: Negative for congestion, postnasal drip and sore throat.   Eyes: Negative.   Respiratory: Positive for shortness of breath.  Negative for chest tightness.   Cardiovascular: Negative for chest pain, palpitations and leg swelling.  Gastrointestinal: Negative for abdominal pain and abdominal distention.  Endocrine: Negative.   Genitourinary: Negative.   Musculoskeletal: Negative for back pain and neck pain.  Skin: Negative.   Allergic/Immunologic: Negative.   Neurological: Negative for dizziness, light-headedness and headaches.  Hematological: Negative for adenopathy. Does not bruise/bleed easily.  Psychiatric/Behavioral: Positive for sleep disturbance (not sleeping well). Negative for dysphoric mood. The patient is not nervous/anxious.        Objective:   Physical Exam  Constitutional: He is oriented to person, place, and time. He appears well-developed and well-nourished.  HENT:  Head: Normocephalic and atraumatic.  Eyes: Conjunctivae are normal. Pupils are equal, round, and reactive to light.  Neck: Normal range of motion. Neck supple.  Cardiovascular: Normal rate and regular rhythm.   No murmur heard. Pulmonary/Chest: Effort normal. He has no wheezes. He has no rales.  Abdominal: Soft. He exhibits no distension. There is no tenderness.  Musculoskeletal: He exhibits no edema or tenderness.  Neurological: He is alert and oriented to person, place, and time.  Skin: Skin is warm and dry.  Psychiatric: He has a normal mood and affect. His behavior is normal. Thought content normal.  Nursing note and vitals reviewed.   BP 129/64 mmHg  Pulse 69  Resp 18  Ht 5\' 8"  (1.727 m)  Wt 233 lb (105.688 kg)  BMI 35.44 kg/m2  SpO2 94%       Assessment & Plan:  1: Chronic heart failure with reduced ejection fraction- Patient presents with fatigue and shortness of breath with exertion. He did walk into the office with his walker and said that he was a little tired but once he sat down for a few minutes, his energy returned. He has been weighing himself and reports a gradual weight gain. By our scale, he's gained 5  pounds since 12/25/15 but according to his home weight chart, he's not had any overnight weight gains. Reminded to call for an overnight weight gain of >2 pounds or a weekly weight gain of >5 pounds. He has decreased his torsemide to 1/2 tablet (10mg ) daily as the 20mg  dose was keeping him running to the bathroom. Discussed that he could always take an entire 20mg  tablet if needed for above weight parameters or for swelling. Did talk about switching his lisinopril to entresto but he'd like to speak with his cardiologist first and he has an appointment with him on 01/26/16. Talked about cardiac rehab and he's interested but he'd like to wait to set that up. 2: HTN- Blood pressure looks good. Continue medications. 3: Obstructive sleep apnea- He's had a sleep study done since he was here last but hasn't heard anything about the results.  4: Depression- Patient says that he's doing well at this time. Is getting ready to move into independent living at [facility] on 02/07/16 and he has mixed feelings  about it. Wife passed away November 2016.  Return here in 2 months or sooner for any questions/problems before then.

## 2016-01-22 NOTE — Patient Instructions (Signed)
Continue weighing daily and call for an overnight weight gain of > 2 pounds or a weekly weight gain of >5 pounds. 

## 2016-02-24 ENCOUNTER — Telehealth: Payer: Self-pay

## 2016-02-24 ENCOUNTER — Telehealth: Payer: Self-pay | Admitting: Family

## 2016-02-24 NOTE — Telephone Encounter (Signed)
Jermaine [name] called yesterday after hours and left a message stating he would like to speak to you. He did not leave a reason as to why.

## 2016-02-24 NOTE — Telephone Encounter (Signed)
Returned patient's call regarding his shortness of breath. He says that he feels a little more short of breath than usual but says that his weight is unchanged. He's currently taking 10mg  of torsemide daily because he says that if he takes a whole 20mg  dose, he's urinating "all the time". Encouraged him to take the 20mg  dose today and see if that helps improve his breathing. He's already taking 20meq potassium daily. He also says that he has a letter from the sleep lab that they've been trying to get ahold of him regarding his sleep apnea and he's going to give them a call today. Discussed how untreated sleep apnea could be contributing to his shortness of breath as well. Also discussed referral to a pulmonologist but will hold off on that until he gets things settled with the sleep lab.   He was wondering if he would benefit from wearing oxygen. Explained that he would have to qualify for oxygen depending on his oxygen level and that he might have to wear an overnight oximetry which would need to be ordered by his doctor. Patient voiced understanding and said he would hold off on that for now.   Encouraged patient to call us back if his breathing did not improve or if he needed assistance in getting in touch with the sleep lab. Patient was comfortable with this and was appreciative of the call.

## 2016-03-11 ENCOUNTER — Emergency Department: Payer: Medicare Other

## 2016-03-11 ENCOUNTER — Inpatient Hospital Stay
Admission: EM | Admit: 2016-03-11 | Discharge: 2016-03-14 | DRG: 291 | Disposition: A | Discharge status: Home Health | Payer: Medicare Other | Attending: Internal Medicine | Admitting: Internal Medicine

## 2016-03-11 DIAGNOSIS — E119 Type 2 diabetes mellitus without complications: Secondary | ICD-10-CM | POA: Diagnosis present

## 2016-03-11 DIAGNOSIS — Z7984 Long term (current) use of oral hypoglycemic drugs: Secondary | ICD-10-CM

## 2016-03-11 DIAGNOSIS — G2581 Restless legs syndrome: Secondary | ICD-10-CM | POA: Diagnosis present

## 2016-03-11 DIAGNOSIS — I255 Ischemic cardiomyopathy: Secondary | ICD-10-CM | POA: Diagnosis present

## 2016-03-11 DIAGNOSIS — I110 Hypertensive heart disease with heart failure: Principal | ICD-10-CM | POA: Diagnosis present

## 2016-03-11 DIAGNOSIS — F329 Major depressive disorder, single episode, unspecified: Secondary | ICD-10-CM | POA: Diagnosis present

## 2016-03-11 DIAGNOSIS — I442 Atrioventricular block, complete: Secondary | ICD-10-CM | POA: Diagnosis present

## 2016-03-11 DIAGNOSIS — G20 Parkinson's disease: Secondary | ICD-10-CM | POA: Diagnosis present

## 2016-03-11 DIAGNOSIS — J9601 Acute respiratory failure with hypoxia: Secondary | ICD-10-CM | POA: Diagnosis present

## 2016-03-11 DIAGNOSIS — Z7982 Long term (current) use of aspirin: Secondary | ICD-10-CM

## 2016-03-11 DIAGNOSIS — Z79899 Other long term (current) drug therapy: Secondary | ICD-10-CM

## 2016-03-11 DIAGNOSIS — M109 Gout, unspecified: Secondary | ICD-10-CM | POA: Diagnosis present

## 2016-03-11 DIAGNOSIS — Z950 Presence of cardiac pacemaker: Secondary | ICD-10-CM

## 2016-03-11 DIAGNOSIS — G4733 Obstructive sleep apnea (adult) (pediatric): Secondary | ICD-10-CM | POA: Diagnosis present

## 2016-03-11 DIAGNOSIS — Z951 Presence of aortocoronary bypass graft: Secondary | ICD-10-CM | POA: Diagnosis not present

## 2016-03-11 DIAGNOSIS — I2510 Atherosclerotic heart disease of native coronary artery without angina pectoris: Secondary | ICD-10-CM | POA: Diagnosis present

## 2016-03-11 DIAGNOSIS — E785 Hyperlipidemia, unspecified: Secondary | ICD-10-CM | POA: Diagnosis present

## 2016-03-11 DIAGNOSIS — I5023 Acute on chronic systolic (congestive) heart failure: Secondary | ICD-10-CM | POA: Diagnosis present

## 2016-03-11 DIAGNOSIS — I509 Heart failure, unspecified: Secondary | ICD-10-CM | POA: Diagnosis present

## 2016-03-11 LAB — CBC WITH DIFFERENTIAL/PLATELET
Basophils Absolute: 0 10*3/uL (ref 0–0.1)
Basophils Relative: 1 %
EOS ABS: 0.1 10*3/uL (ref 0–0.7)
EOS PCT: 1 %
HCT: 38.2 % — ABNORMAL LOW (ref 40.0–52.0)
Hemoglobin: 12.7 g/dL — ABNORMAL LOW (ref 13.0–18.0)
LYMPHS ABS: 0.7 10*3/uL — AB (ref 1.0–3.6)
LYMPHS PCT: 10 %
MCH: 28.9 pg (ref 26.0–34.0)
MCHC: 33.2 g/dL (ref 32.0–36.0)
MCV: 87 fL (ref 80.0–100.0)
MONO ABS: 0.3 10*3/uL (ref 0.2–1.0)
MONOS PCT: 4 %
Neutro Abs: 6.2 10*3/uL (ref 1.4–6.5)
Neutrophils Relative %: 84 %
PLATELETS: 130 10*3/uL — AB (ref 150–440)
RBC: 4.39 MIL/uL — AB (ref 4.40–5.90)
RDW: 16 % — AB (ref 11.5–14.5)
WBC: 7.3 10*3/uL (ref 3.8–10.6)

## 2016-03-11 LAB — BASIC METABOLIC PANEL
Anion gap: 5 (ref 5–15)
BUN: 17 mg/dL (ref 6–20)
CO2: 26 mmol/L (ref 22–32)
CREATININE: 0.75 mg/dL (ref 0.61–1.24)
Calcium: 8.6 mg/dL — ABNORMAL LOW (ref 8.9–10.3)
Chloride: 107 mmol/L (ref 101–111)
GFR calc Af Amer: >60 mL/min (ref >60)
GLUCOSE: 110 mg/dL — AB (ref 65–99)
POTASSIUM: 4.1 mmol/L (ref 3.5–5.1)
SODIUM: 138 mmol/L (ref 135–145)

## 2016-03-11 LAB — TROPONIN I: Troponin I: 0.03 ng/mL (ref <0.031)

## 2016-03-11 LAB — URINALYSIS COMPLETE WITH MICROSCOPIC (ARMC ONLY)
BACTERIA UA: NONE SEEN
BILIRUBIN URINE: NEGATIVE
GLUCOSE, UA: NEGATIVE mg/dL
HGB URINE DIPSTICK: NEGATIVE
KETONES UR: NEGATIVE mg/dL
LEUKOCYTES UA: NEGATIVE
NITRITE: NEGATIVE
PH: 7 (ref 5.0–8.0)
Protein, ur: NEGATIVE mg/dL
SPECIFIC GRAVITY, URINE: 1.004 — AB (ref 1.005–1.030)
Squamous Epithelial / LPF: NONE SEEN
WBC, UA: NONE SEEN WBC/hpf (ref 0–5)

## 2016-03-11 LAB — BRAIN NATRIURETIC PEPTIDE: B NATRIURETIC PEPTIDE 5: 795 pg/mL — AB (ref 0.0–100.0)

## 2016-03-11 MED ORDER — DOXAZOSIN MESYLATE 4 MG PO TABS
8.0000 mg | ORAL_TABLET | Freq: Every day | ORAL | Status: DC
  Administered 2016-03-11 – 2016-03-14 (×4): 8 mg via ORAL
  Filled 2016-03-11 (×4): qty 2

## 2016-03-11 MED ORDER — SODIUM CHLORIDE 0.9% FLUSH
3.0000 mL | Freq: Two times a day (BID) | INTRAVENOUS | Status: DC
  Administered 2016-03-11 – 2016-03-13 (×4): 3 mL via INTRAVENOUS

## 2016-03-11 MED ORDER — CARVEDILOL 3.125 MG PO TABS
3.1250 mg | ORAL_TABLET | Freq: Two times a day (BID) | ORAL | Status: DC
  Administered 2016-03-11 – 2016-03-14 (×6): 3.125 mg via ORAL
  Filled 2016-03-11 (×6): qty 1

## 2016-03-11 MED ORDER — ALLOPURINOL 300 MG PO TABS
300.0000 mg | ORAL_TABLET | Freq: Every day | ORAL | Status: DC
  Administered 2016-03-11 – 2016-03-14 (×4): 300 mg via ORAL
  Filled 2016-03-11 (×4): qty 1

## 2016-03-11 MED ORDER — FUROSEMIDE 10 MG/ML IJ SOLN
40.0000 mg | Freq: Once | INTRAMUSCULAR | Status: AC
  Administered 2016-03-11: 40 mg via INTRAVENOUS
  Filled 2016-03-11: qty 4

## 2016-03-11 MED ORDER — SODIUM CHLORIDE 0.9% FLUSH
3.0000 mL | Freq: Two times a day (BID) | INTRAVENOUS | Status: DC
  Administered 2016-03-12 – 2016-03-14 (×4): 3 mL via INTRAVENOUS

## 2016-03-11 MED ORDER — MELATONIN 5 MG PO TABS: 5.0000 mg | ORAL_TABLET | Freq: Every day | ORAL | Status: DC

## 2016-03-11 MED ORDER — VITAMIN D 1000 UNITS PO TABS
1000.0000 [IU] | ORAL_TABLET | Freq: Every day | ORAL | Status: DC
Start: 2016-03-11 — End: 2016-03-14
  Administered 2016-03-11 – 2016-03-14 (×4): 1000 [IU] via ORAL
  Filled 2016-03-11 (×4): qty 1

## 2016-03-11 MED ORDER — ACETAMINOPHEN 325 MG PO TABS
650.0000 mg | ORAL_TABLET | Freq: Four times a day (QID) | ORAL | Status: DC | PRN
  Administered 2016-03-12: 650 mg via ORAL
  Filled 2016-03-11: qty 2

## 2016-03-11 MED ORDER — SODIUM CHLORIDE 0.9% FLUSH: 3.0000 mL | INTRAVENOUS | Status: DC | PRN

## 2016-03-11 MED ORDER — ACETAMINOPHEN 650 MG RE SUPP: 650.0000 mg | Freq: Four times a day (QID) | RECTAL | Status: DC | PRN

## 2016-03-11 MED ORDER — SODIUM CHLORIDE 0.9 % IV SOLN: 250.0000 mL | INTRAVENOUS | Status: DC | PRN

## 2016-03-11 MED ORDER — POTASSIUM CHLORIDE CRYS ER 20 MEQ PO TBCR
20.0000 meq | EXTENDED_RELEASE_TABLET | Freq: Every day | ORAL | Status: DC
  Administered 2016-03-11 – 2016-03-14 (×4): 20 meq via ORAL
  Filled 2016-03-11 (×4): qty 1

## 2016-03-11 MED ORDER — ZOLPIDEM TARTRATE 5 MG PO TABS: 5.0000 mg | ORAL_TABLET | Freq: Every evening | ORAL | Status: DC | PRN

## 2016-03-11 MED ORDER — FUROSEMIDE 10 MG/ML IJ SOLN
40.0000 mg | Freq: Two times a day (BID) | INTRAMUSCULAR | Status: DC
  Administered 2016-03-11 – 2016-03-13 (×4): 40 mg via INTRAVENOUS
  Filled 2016-03-11 (×4): qty 4

## 2016-03-11 MED ORDER — CARBIDOPA-LEVODOPA 25-250 MG PO TABS
2.0000 | ORAL_TABLET | Freq: Two times a day (BID) | ORAL | Status: DC
  Administered 2016-03-11 – 2016-03-14 (×6): 2 via ORAL
  Filled 2016-03-11 (×9): qty 2

## 2016-03-11 MED ORDER — PRAVASTATIN SODIUM 20 MG PO TABS
20.0000 mg | ORAL_TABLET | Freq: Every day | ORAL | Status: DC
  Administered 2016-03-11 – 2016-03-13 (×3): 20 mg via ORAL
  Filled 2016-03-11 (×3): qty 1

## 2016-03-11 MED ORDER — DOCUSATE SODIUM 100 MG PO CAPS
100.0000 mg | ORAL_CAPSULE | Freq: Two times a day (BID) | ORAL | Status: DC
  Administered 2016-03-11 – 2016-03-14 (×6): 100 mg via ORAL
  Filled 2016-03-11 (×6): qty 1

## 2016-03-11 MED ORDER — ENOXAPARIN SODIUM 40 MG/0.4ML ~~LOC~~ SOLN
40.0000 mg | SUBCUTANEOUS | Status: DC
  Administered 2016-03-12 – 2016-03-13 (×2): 40 mg via SUBCUTANEOUS
  Filled 2016-03-11 (×2): qty 0.4

## 2016-03-11 MED ORDER — ROPINIROLE HCL 1 MG PO TABS
1.0000 mg | ORAL_TABLET | Freq: Two times a day (BID) | ORAL | Status: DC
  Administered 2016-03-11 – 2016-03-14 (×6): 1 mg via ORAL
  Filled 2016-03-11 (×6): qty 1

## 2016-03-11 MED ORDER — LISINOPRIL 20 MG PO TABS
20.0000 mg | ORAL_TABLET | Freq: Every day | ORAL | Status: DC
  Administered 2016-03-11 – 2016-03-14 (×4): 20 mg via ORAL
  Filled 2016-03-11 (×4): qty 1

## 2016-03-11 MED ORDER — ASPIRIN 325 MG PO TABS
325.0000 mg | ORAL_TABLET | Freq: Every day | ORAL | Status: DC
  Administered 2016-03-11 – 2016-03-13 (×3): 325 mg via ORAL
  Filled 2016-03-11 (×3): qty 1

## 2016-03-11 NOTE — Progress Notes (Signed)

## 2016-03-11 NOTE — H&P (Signed)
[facility] [place] [facility]   PATIENT NAME: Jermaine SableCharles [name]    MR#:  [id number]  DATE OF BIRTH:  Oct 09, 1929  DATE OF ADMISSION:  03/11/2016  PRIMARY CARE PHYSICIAN: Lauro RegulusANDERSON,[name]., MD   REQUESTING/REFERRING PHYSICIAN: McShane  CHIEF COMPLAINT:   Shortness of breath HISTORY OF PRESENT ILLNESS:  Jermaine [name]  is a 82 y.o. male with a known history of chronic congestive heart failure with   estimate ejection fraction approximately 20% on the last echo, history of coronary artery bypass grafting in the year 2008, complete heart block status post pacemaker and multiple other medical problems is presenting to the ED with a chief complaint of worsening of shortness of breath. Patient is reporting orthopnea and worsening of shortness of breath with exertion for the past 5 days. He denies any leg swelling. Denies any chest pain. He was given IV Lasix in the ED and hospitalist team is called to admit the patient. PAST MEDICAL HISTORY:   Past Medical History  Diagnosis Date  . Hypertension   . Diabetes mellitus without complication (HCC)   . Parkinson's disease (HCC) 2008  . CHF (congestive heart failure) (HCC)   . LVH (left ventricular hypertrophy)   . OSA (obstructive sleep apnea)     PAST SURGICAL HISTOIRY:   Past Surgical History  Procedure Laterality Date  . 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 Pacemaker placement N/A 2016    SOCIAL HISTORY:   Social History  Substance Use Topics  . Smoking status: Never Smoker   . Smokeless tobacco: Never Used  . Alcohol Use: No    FAMILY HISTORY:   Family History  Problem Relation Age of Onset  . Anemia Neg Hx   . Arrhythmia Neg Hx   . Asthma Neg Hx   . Clotting disorder Neg Hx   . Fainting Neg Hx   . Heart attack Neg Hx   . Heart disease Neg Hx   . Heart failure Neg Hx   . Hyperlipidemia Neg Hx   . Hypertension Neg Hx     DRUG ALLERGIES:  No Known Allergies  REVIEW OF  SYSTEMS:  CONSTITUTIONAL: No fever, fatigue or weakness.  EYES: No blurred or double vision.  EARS, NOSE, AND THROAT: No tinnitus or ear pain.  RESPIRATORY: No cough,  reporting worsening of shortness of breath,  denies any wheezing or hemoptysis.  CARDIOVASCULAR: No chest pain, orthopnea, edema.  GASTROINTESTINAL: No nausea, vomiting, diarrhea or abdominal pain.  GENITOURINARY: No dysuria, hematuria.  ENDOCRINE: No polyuria, nocturia,  HEMATOLOGY: No anemia, easy bruising or bleeding SKIN: No rash or lesion. MUSCULOSKELETAL: No joint pain or arthritis.   NEUROLOGIC: No tingling, numbness, weakness.  PSYCHIATRY: No anxiety or depression.   MEDICATIONS AT HOME:   Prior to Admission medications   Medication Sig Start Date End Date Taking? Authorizing Provider  allopurinol (ZYLOPRIM) 300 MG tablet Take 300 mg by mouth daily.    Yes Historical Provider, MD  aspirin 325 MG tablet Take 325 mg by mouth at bedtime.   Yes Historical Provider, MD  carbidopa-levodopa (SINEMET IR) 25-250 MG tablet Take 2 tablets by mouth 2 (two) times daily.   Yes Historical Provider, MD  carvedilol (COREG) 3.125 MG tablet Take 3.125 mg by mouth 2 (two) times daily with a meal.   Yes Historical Provider, MD  cholecalciferol (VITAMIN D) 1000 UNITS tablet Take 1,000 Units by mouth daily.   Yes Historical Provider, MD  docusate  sodium (COLACE) 100 MG capsule Take 100 mg by mouth every other day.   Yes Historical Provider, MD  doxazosin (CARDURA) 8 MG tablet Take 8 mg by mouth daily.    Yes Historical Provider, MD  lisinopril (PRINIVIL,ZESTRIL) 20 MG tablet Take 20 mg by mouth daily.   Yes Historical Provider, MD  Melatonin 5 MG TABS Take 5 mg by mouth at bedtime.    Yes Historical Provider, MD  potassium chloride SA (K-DUR,KLOR-CON) 20 MEQ tablet Take 20 mEq by mouth daily.    Yes Historical Provider, MD  pravastatin (PRAVACHOL) 20 MG tablet Take 20 mg by mouth at bedtime.    Yes Historical Provider, MD  rOPINIRole  (REQUIP) 1 MG tablet Take 1 mg by mouth 2 (two) times daily.    Yes Historical Provider, MD  torsemide (DEMADEX) 20 MG tablet Take 20 mg by mouth daily.    Yes Historical Provider, MD      VITAL SIGNS:  Blood pressure 176/97, pulse 75, temperature 97 F (36.1 C), temperature source Oral, resp. rate 18, height  (1.727 m), weight 108.41 kg (239 lb), SpO2 97 %.  PHYSICAL EXAMINATION:  GENERAL:  82 y.o.-year-old patient lying in the bed with no acute distress.  EYES: Pupils equal, round, reactive to light and accommodation. No scleral icterus. Extraocular muscles intact.  HEENT: Head atraumatic, normocephalic. Oropharynx and nasopharynx clear.  NECK:  Supple, no jugular venous distention. No thyroid enlargement, no tenderness.  LUNGS: diminished breath sounds sounds bilaterally, no wheezing,  Positive rales,rhonchi ,no  crepitation. No use of accessory muscles of respiration.  CARDIOVASCULAR: S1, S2 normal. No murmurs, rubs, or gallops.  ABDOMEN: Soft, nontender, nondistended. Bowel sounds present. No organomegaly or mass.  EXTREMITIES: No pedal edema, cyanosis, or clubbing.  NEUROLOGIC: Cranial nerves II through XII are intact. Muscle strength 5/5 in all extremities. Sensation intact. Gait not checked.  PSYCHIATRIC: The patient is alert and oriented x 3.  SKIN: No obvious rash, lesion, or ulcer.   LABORATORY PANEL:   CBC  Recent Labs Lab 03/11/16 1124  WBC 7.3  HGB 12.7*  HCT 38.2*  PLT 130*   ------------------------------------------------------------------------------------------------------------------  Chemistries   Recent Labs Lab 03/11/16 1124  NA 138  K 4.1  CL 107  CO2 26  GLUCOSE 110*  BUN 17  CREATININE 0.75  CALCIUM 8.6*   ------------------------------------------------------------------------------------------------------------------  Cardiac Enzymes  Recent Labs Lab 03/11/16 1124  TROPONINI 0.03    ------------------------------------------------------------------------------------------------------------------  RADIOLOGY:  Dg Chest 2 View  03/11/2016  CLINICAL DATA:  Acute shortness of breath. EXAM: CHEST  2 VIEW COMPARISON:  December 17, 2015. FINDINGS: Stable cardiomegaly. Status post coronary artery bypass graft. Left-sided pacemaker is unchanged in position. No pneumothorax is noted. Mild central pulmonary vascular congestion is noted. Minimal bilateral pleural effusions are noted. Mild bibasilar interstitial densities are noted concerning for scarring or edema. Anterior osteophyte formation is noted in the lower thoracic spine. IMPRESSION: Mild bibasilar interstitial densities are noted concerning for scarring or edema. Minimal bilateral pleural effusions are noted Electronically Signed   By: Lupita Raider, M.D.   On: 03/11/2016 10:13    EKG:   Orders placed or performed during the hospital encounter of 03/11/16  . EKG 12-Lead  . EKG 12-Lead    IMPRESSION AND PLAN:  Jermaine [name]  is a 82 y.o. male with a known history of chronic congestive heart failure with   estimate ejection fraction approximately 20% on the last echo, history of coronary artery bypass grafting in  the year 2008, complete heart block status post pacemaker and multiple other medical problems is presenting to the ED with a chief complaint of worsening of shortness of breath. Patient is reporting orthopnea and worsening of shortness of breath with exertion for the past 5 days.   #1 . Acute respiratory distress secondary to acute exacerbation of systolic congestive heart failure Admitted to telemetry Provide IV Lasix, monitor intake and output and daily weights consult cardiology Dr. Gwen Pounds and obtain echocardiogram continue home medications Coreg, aspirin, statin and lisinopril  #2 essential hypertension We'll continue home medication lisinopril, Cardura and Coreg  #3: History of Parkinson's disease  continue Sinemet  #4 DM - 2, NIDDM  ISS, diabetic diet       All the records are reviewed and case discussed with ED provider. Management plans discussed with the patient, family and they are in agreement.  CODE STATUS: DO NOT RESUSCITATE  TOTAL TIME TAKING CARE OF THIS PATIENT: 45 minutes.    Ramonita Lab M.D on 03/11/2016 at 5:07 PM  Between 7am to 6pm - Pager - [phone]  After 6pm go to [email or web address] - password [email or web address] [email or web address]  [facility]  Office  [phone]  CC: Primary care physician; Lauro Regulus., MD

## 2016-03-11 NOTE — ED Notes (Signed)
Pt c/o increased SOB over the 5 days, pt is tachypnic on arrival..

## 2016-03-11 NOTE — ED Provider Notes (Addendum)
[facility] Emergency Department Provider Note  ____________________________________________   I have reviewed the triage vital signs and the nursing notes.   HISTORY  Chief Complaint Shortness of Breath    HPI Jermaine [name]. is a 82 y.o. male with a history of CABG in 08, complete heart block status post pacemaker, and significant CHF with an EF estimated approximately 20% on last echo presents today with increasing orthopnea and significant dyspnea on exertion. This is been going on for 5 days. Patient is in a low dose of Lasix and has been taking it. He denies any fever chills cough or chest pain. He does not have any leg swelling. States his shortness of breath as significant compared to what he is used to. No other associated symptoms aside from orthopnea. Has had extensive prior treatment for this for CHF with prior admissions. Patient states the symptoms were going on for 5 days.      Past Medical History  Diagnosis Date  . Hypertension   . Diabetes mellitus without complication (HCC)   . Parkinson's disease (HCC) 2008  . CHF (congestive heart failure) (HCC)   . LVH (left ventricular hypertrophy)   . OSA (obstructive sleep apnea)     Patient Active Problem List   Diagnosis Date Noted  . Chronic systolic heart failure (HCC) 12/25/2015  . Essential hypertension 12/25/2015  . Obstructive sleep apnea 12/25/2015  . Depression 12/25/2015  . Parkinson disease (HCC) 12/25/2015    Past Surgical History  Procedure Laterality Date  . 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 Pacemaker placement N/A 2016    Current Outpatient Rx  Name  Route  Sig  Dispense  Refill  . allopurinol (ZYLOPRIM) 300 MG tablet   Oral   Take 150 mg by mouth daily.          Marland Kitchen aspirin 325 MG tablet   Oral   Take 325 mg by mouth at bedtime.         . carbidopa-levodopa (PARCOPA) 25-250 MG per disintegrating tablet   Oral   Take 2 tablets by mouth 3  (three) times daily.         . carvedilol (COREG) 3.125 MG tablet   Oral   Take 3.125 mg by mouth 2 (two) times daily with a meal.         . cholecalciferol (VITAMIN D) 1000 UNITS tablet   Oral   Take 1,000 Units by mouth daily.         Marland Kitchen doxazosin (CARDURA) 8 MG tablet   Oral   Take 4 mg by mouth daily.          Marland Kitchen lisinopril (PRINIVIL,ZESTRIL) 20 MG tablet   Oral   Take 20 mg by mouth daily.         . Melatonin 5 MG TABS   Oral   Take 1 tablet by mouth at bedtime.         . potassium chloride SA (K-DUR,KLOR-CON) 20 MEQ tablet   Oral   Take 20 mEq by mouth daily.          . pravastatin (PRAVACHOL) 20 MG tablet   Oral   Take 20 mg by mouth at bedtime.          Marland Kitchen rOPINIRole (REQUIP) 1 MG tablet   Oral   Take 1 mg by mouth 2 (two) times daily at 10 AM and 5 PM.         .  torsemide (DEMADEX) 20 MG tablet   Oral   Take 10 mg by mouth daily.            Allergies Review of patient's allergies indicates no known allergies.  Family History  Problem Relation Age of Onset  . Anemia Neg Hx   . Arrhythmia Neg Hx   . Asthma Neg Hx   . Clotting disorder Neg Hx   . Fainting Neg Hx   . Heart attack Neg Hx   . Heart disease Neg Hx   . Heart failure Neg Hx   . Hyperlipidemia Neg Hx   . Hypertension Neg Hx     Social History Social History  Substance Use Topics  . Smoking status: Never Smoker   . Smokeless tobacco: Never Used  . Alcohol Use: No    Review of Systems Constitutional: No fever/chills Eyes: No visual changes. ENT: No sore throat. No stiff neck no neck pain Cardiovascular: Denies chest pain. Respiratory: Positive shortness of breath. Gastrointestinal:   no vomiting.  No diarrhea.  No constipation. Genitourinary: Negative for dysuria. Musculoskeletal: Negative lower extremity swelling Skin: Negative for rash. Neurological: Negative for headaches, focal weakness or numbness. 10-point ROS otherwise  negative.  ____________________________________________   PHYSICAL EXAM:  VITAL SIGNS: ED Triage Vitals  Enc Vitals Group     BP 03/11/16 0932 173/87 mmHg     Pulse Rate 03/11/16 0932 76     Resp 03/11/16 0932 26     Temp 03/11/16 0932 97 F (36.1 C)     Temp Source 03/11/16 0932 Oral     SpO2 03/11/16 0935 96 %     Weight 03/11/16 0932 239 lb (108.41 kg)     Height 03/11/16 0932 5\' 8"  (1.727 m)     Head Cir --      Peak Flow --      Pain Score --      Pain Loc --      Pain Edu? --      Excl. in GC? --     Constitutional: Alert and oriented. Mild tachypnea but otherwise in no acute distress Eyes: Conjunctivae are normal. PERRL. EOMI. Head: Atraumatic. Nose: No congestion/rhinnorhea. Mouth/Throat: Mucous membranes are moist.  Oropharynx non-erythematous. Neck: No stridor.   Nontender with no meningismus Cardiovascular: Normal rate, regular rhythm. Grossly normal heart sounds.  Good peripheral circulation. Respiratory: Tachypnea and slight increased work of breathing noted. Lungs show bibasilar rails Abdominal: Soft and nontender. No distention. No guarding no rebound Back:  There is no focal tenderness or step off there is no midline tenderness there are no lesions noted. there is no CVA tenderness Musculoskeletal: No lower extremity tenderness. No joint effusions, no DVT signs strong distal pulses no edema Neurologic:  Normal speech and language. No gross focal neurologic deficits are appreciated.  Skin:  Skin is warm, dry and intact. No rash noted. Psychiatric: Mood and affect are normal. Speech and behavior are normal.  ____________________________________________   LABS (all labs ordered are listed, but only abnormal results are displayed)  Labs Reviewed  TROPONIN I  CBC WITH DIFFERENTIAL/PLATELET  BASIC METABOLIC PANEL  BRAIN NATRIURETIC PEPTIDE   ____________________________________________  EKG  I personally interpreted any EKGs ordered by me or  triage AV paced rhythm with rate of 75 ____________________________________________  RADIOLOGY  I reviewed any imaging ordered by me or triage that were performed during my shift and, if possible, patient and/or family made aware of any abnormal findings. ____________________________________________   PROCEDURES  Procedure(s)  performed: None  Critical Care performed: CRITICAL CARE Performed by: Jeanmarie Plant   Total critical care time: 35 minutes  Critical care time was exclusive of separately billable procedures and treating other patients.  Critical care was necessary to treat or prevent imminent or life-threatening deterioration.  Critical care was time spent personally by me on the following activities: development of treatment plan with patient and/or surrogate as well as nursing, discussions with consultants, evaluation of patient's response to treatment, examination of patient, obtaining history from patient or surrogate, ordering and performing treatments and interventions, ordering and review of laboratory studies, ordering and review of radiographic studies, pulse oximetry and re-evaluation of patient's condition.   ____________________________________________   INITIAL IMPRESSION / ASSESSMENT AND PLAN / ED COURSE  Pertinent labs & imaging results that were available during my care of the patient were reviewed by me and considered in my medical decision making (see chart for details).  Patient with clinical evidence of become stated heart failure with orthopnea, dyspnea at rest and dyspnea on exertion. We'll give him Lasix empirically, no chest pain or fever or cough we will obtain chest x-ray, check a troponin although again no chest pain and reassess. Patient likely given his age and decompensated status will likely require admission  ----------------------------------------- 2:22 PM on 03/11/2016 -----------------------------------------  Vision and have  desaturation events here, we did place him on oxygen, he is diuresing well with fluid and he agrees to admission. ____________________________________________   FINAL CLINICAL IMPRESSION(S) / ED DIAGNOSES  Final diagnoses:  None      This chart was dictated using voice recognition software.  Despite best efforts to proofread,  errors can occur which can change meaning.     Jeanmarie Plant, MD 03/11/16 [id number]  Jeanmarie Plant, MD 03/11/16 [phone]

## 2016-03-11 NOTE — ED Notes (Signed)
Report given to Leslie Dalesoll, Charity fundraiserN.

## 2016-03-11 NOTE — ED Notes (Signed)
Pt placed on 2L [place] at this time. 

## 2016-03-12 LAB — CBC
HCT: 41 % (ref 40.0–52.0)
HEMOGLOBIN: 13.8 g/dL (ref 13.0–18.0)
MCH: 29.7 pg (ref 26.0–34.0)
MCHC: 33.6 g/dL (ref 32.0–36.0)
MCV: 88.4 fL (ref 80.0–100.0)
Platelets: 134 10*3/uL — ABNORMAL LOW (ref 150–440)
RBC: 4.64 MIL/uL (ref 4.40–5.90)
RDW: 16.1 % — ABNORMAL HIGH (ref 11.5–14.5)
WBC: 7.6 10*3/uL (ref 3.8–10.6)

## 2016-03-12 LAB — BASIC METABOLIC PANEL
ANION GAP: 4 — AB (ref 5–15)
BUN: 18 mg/dL (ref 6–20)
CALCIUM: 8.9 mg/dL (ref 8.9–10.3)
CHLORIDE: 104 mmol/L (ref 101–111)
CO2: 32 mmol/L (ref 22–32)
Creatinine, Ser: 0.88 mg/dL (ref 0.61–1.24)
GFR calc non Af Amer: >60 mL/min (ref >60)
Glucose, Bld: 93 mg/dL (ref 65–99)
Potassium: 3.5 mmol/L (ref 3.5–5.1)
Sodium: 140 mmol/L (ref 135–145)

## 2016-03-12 LAB — TSH: TSH: 2.198 u[IU]/mL (ref 0.350–4.500)

## 2016-03-12 NOTE — Care Management Note (Signed)
Case Management Note  Patient Details  Name: Jermaine HABEEB Sr. MRN: [id number] Date of Birth: 1929-09-03  Subjective/Objective:  CM assessment for discharge planning. Met with patient at bedside. He lives at [employer]. He uses a cane and a walker. No chronic home O2. He states he still drives himself to his doctor and for necessities and tries to remain indepedent but has a lot of family that helps him as well.  He refuses any home health stating, "I have an emergency button and can have a nurse from [facility] come over at any time."  Patient requests a hospital bed due to his shortness of breath. Spoke with Dr. Posey Pronto and order received. Ordered from Will with [facility].  He currently is on RA but CM requested primary nurse assess patient for home O2 and PT eval. PCP is Dr. Ouida Sills, Cardiologist is Dr. Nehemiah Massed.  Patient denies issues obtaining medications, copays or other financial concerns.                 Action/Plan: Assess for need for Home O2.   Expected Discharge Date:                  Expected Discharge Plan:  Home/Self Care  In-House Referral:     Discharge planning Services  CM Consult  Post Acute Care Choice:  Home Health Choice offered to:  Patient  DME Arranged:    DME Agency:     HH Arranged:  Patient Refused [place] Agency:     Status of Service:  In process, will continue to follow  Medicare Important Message Given:    Date Medicare IM Given:    Medicare IM give by:    Date Additional Medicare IM Given:    Additional Medicare Important Message give by:     If discussed at [place] of Stay Meetings, dates discussed:    Additional Comments:  Jolly Mango, RN 03/12/2016, 11:06 AM

## 2016-03-12 NOTE — Progress Notes (Signed)
Attempted to walk patient to assess home oxygen need patient refused will try again at 2pm.

## 2016-03-12 NOTE — Care Management (Signed)
Patient requires frequent position changes and HOB needs to be elevated 30 degrees

## 2016-03-12 NOTE — Progress Notes (Signed)
SATURATION QUALIFICATIONS: (This note is used to comply with regulatory documentation for home oxygen)  Patient Saturations on Room Air at Rest = 95%  Patient Saturations on Room Air while Ambulating = 95%  Patient Saturations on  Liters of oxygen while Ambulating = %  Please briefly explain why patient needs home oxygen: no O2 needed.

## 2016-03-12 NOTE — Progress Notes (Signed)
[facility] - [place] at [facility]                                                                                                                                                                                            Patient Demographics   Jermaine [name], is a 82 y.o. male, DOB - 1929-02-20, ZOX:[id number]  Admit date - 03/11/2016   Admitting Physician Ramonita LabAruna Gouru, MD  Outpatient Primary MD for the patient is Lauro RegulusANDERSON,[name]., MD   LOS - 1  Subjective:Patient continues to complain of shortness of breath but feels better compared to being admitted. He denies any chest pains complains of some leg cramps     Review of Systems:   CONSTITUTIONAL: No documented fever. No fatigue, weakness. No weight gain, no weight loss.  EYES: No blurry or double vision.  ENT: No tinnitus. No postnasal drip. No redness of the oropharynx.  RESPIRATORY: No cough, no wheeze, no hemoptysis.Positive dyspnea.  CARDIOVASCULAR: No chest pain. No orthopnea. No palpitations. No syncope.  GASTROINTESTINAL: No nausea, no vomiting or diarrhea. No abdominal pain. No melena or hematochezia.  GENITOURINARY: No dysuria or hematuria.  ENDOCRINE: No polyuria or nocturia. No heat or cold intolerance.  HEMATOLOGY: No anemia. No bruising. No bleeding.  INTEGUMENTARY: No rashes. No lesions.  MUSCULOSKELETAL: No arthritis. No swelling. No gout. Positive leg cramps NEUROLOGIC: No numbness, tingling, or ataxia. No seizure-type activity.  PSYCHIATRIC: No anxiety. No insomnia. No ADD.    Vitals:   Filed Vitals:   03/11/16 1713 03/11/16 1851 03/11/16 2041 03/12/16 0439  BP: 158/105 140/75 145/83 145/76  Pulse: 80 60 63 62  Temp: 97.7 F (36.5 C)  98 F (36.7 C) 97.7 F (36.5 C)  TempSrc: Oral  Oral Oral  Resp: 18  20 20   Height:      Weight: 99.791 kg (220 lb)   99.882 kg (220 lb 3.2 oz)  SpO2: 96% 92% 95% 95%    Wt Readings from Last 3 Encounters:  03/12/16 99.882 kg (220 lb 3.2 oz)   01/22/16 105.688 kg (233 lb)  12/25/15 103.42 kg (228 lb)     Intake/Output Summary (Last 24 hours) at 03/12/16 0829 Last data filed at 03/12/16 0439  Gross per 24 hour  Intake    240 ml  Output   3200 ml  Net  -2960 ml    Physical Exam:   GENERAL: Pleasant-appearing in no apparent distress.  HEAD, EYES, EARS, NOSE AND THROAT: Atraumatic, normocephalic. Extraocular muscles are intact. Pupils equal and reactive to light. Sclerae anicteric. No conjunctival injection. No oro-pharyngeal erythema.  NECK: Supple. There is no  jugular venous distention. No bruits, no lymphadenopathy, no thyromegaly.  HEART: Regular rate and rhythm,. No murmurs, no rubs, no clicks.  LUNGS: Bilateral crackles at the bases no wheezing.  ABDOMEN: Soft, flat, nontender, nondistended. Has good bowel sounds. No hepatosplenomegaly appreciated.  EXTREMITIES: No evidence of any cyanosis, clubbing, or 1+ peripheral edema.  +2 pedal and radial pulses bilaterally.  NEUROLOGIC: The patient is alert, awake, and oriented x3 with no focal motor or sensory deficits appreciated bilaterally.  SKIN: Moist and warm with no rashes appreciated.  Psych: Not anxious, depressed LN: No inguinal LN enlargement    Antibiotics   Anti-infectives    None      Medications   Scheduled Meds: . allopurinol  300 mg Oral Daily  . aspirin  325 mg Oral QHS  . carbidopa-levodopa  2 tablet Oral BID  . carvedilol  3.125 mg Oral BID WC  . cholecalciferol  1,000 Units Oral Daily  . docusate sodium  100 mg Oral BID  . doxazosin  8 mg Oral Daily  . enoxaparin (LOVENOX) injection  40 mg Subcutaneous Q24H  . furosemide  40 mg Intravenous BID  . lisinopril  20 mg Oral Daily  . potassium chloride SA  20 mEq Oral Daily  . pravastatin  20 mg Oral QHS  . rOPINIRole  1 mg Oral BID  . sodium chloride flush  3 mL Intravenous Q12H  . sodium chloride flush  3 mL Intravenous Q12H   Continuous Infusions:  PRN Meds:.sodium chloride, acetaminophen  **OR** acetaminophen, sodium chloride flush, zolpidem   Data Review:   Micro Results No results found for this or any previous visit (from the past 240 hour(s)).  Radiology Reports Dg Chest 2 View  03/11/2016  CLINICAL DATA:  Acute shortness of breath. EXAM: CHEST  2 VIEW COMPARISON:  December 17, 2015. FINDINGS: Stable cardiomegaly. Status post coronary artery bypass graft. Left-sided pacemaker is unchanged in position. No pneumothorax is noted. Mild central pulmonary vascular congestion is noted. Minimal bilateral pleural effusions are noted. Mild bibasilar interstitial densities are noted concerning for scarring or edema. Anterior osteophyte formation is noted in the lower thoracic spine. IMPRESSION: Mild bibasilar interstitial densities are noted concerning for scarring or edema. Minimal bilateral pleural effusions are noted Electronically Signed   By: Lupita Raider, M.D.   On: 03/11/2016 10:13     CBC  Recent Labs Lab 03/11/16 1124 03/12/16 0520  WBC 7.3 7.6  HGB 12.7* 13.8  HCT 38.2* 41.0  PLT 130* 134*  MCV 87.0 88.4  MCH 28.9 29.7  MCHC 33.2 33.6  RDW 16.0* 16.1*  LYMPHSABS 0.7*  --   MONOABS 0.3  --   EOSABS 0.1  --   BASOSABS 0.0  --     Chemistries   Recent Labs Lab 03/11/16 1124 03/12/16 0520  NA 138 140  K 4.1 3.5  CL 107 104  CO2 26 32  GLUCOSE 110* 93  BUN 17 18  CREATININE 0.75 0.88  CALCIUM 8.6* 8.9   ------------------------------------------------------------------------------------------------------------------ estimated creatinine clearance is 71.6 mL/min (by C-G formula based on Cr of 0.88). ------------------------------------------------------------------------------------------------------------------ No results for input(s): HGBA1C in the last 72 hours. ------------------------------------------------------------------------------------------------------------------ No results for input(s): CHOL, HDL, LDLCALC, TRIG, CHOLHDL, LDLDIRECT in  the last 72 hours. ------------------------------------------------------------------------------------------------------------------  Recent Labs  03/12/16 0520  TSH 2.198   ------------------------------------------------------------------------------------------------------------------ No results for input(s): VITAMINB12, FOLATE, FERRITIN, TIBC, IRON, RETICCTPCT in the last 72 hours.  Coagulation profile No results for input(s): INR, PROTIME in the last  168 hours.  No results for input(s): DDIMER in the last 72 hours.  Cardiac Enzymes  Recent Labs Lab 03/11/16 1124  TROPONINI 0.03   ------------------------------------------------------------------------------------------------------------------ Invalid input(s): POCBNP    Assessment & Plan   Jermaine [name] is a 82 y.o. male with a known history of chronic congestive heart failure with  estimate ejection fraction approximately 20% on the last echo, history of coronary artery bypass grafting in the year 2008, complete heart block status post pacemaker and multiple other medical problems is presenting to the ED with a chief complaint of worsening of shortness of breath. Patient is reporting orthopnea and worsening of shortness of breath with exertion for the past 5 days.   #1 . Acute respiratory distress secondary to acute exacerbation of systolic congestive heart failure Continue IV Lasix Patient has responded well Continue diuresis Continue Coreg Continue lisinopril   #2 essential hypertension Blood pressure stable continue lisinopril, Cardura and Coreg  #3: History of Parkinson's disease continue Sinemet  #4 DM - 2, NIDDM continue monitoring the blood sugar  #5 hyperlipidemia continue Pravachol  #6 miscellaneous Lovenox for DVT prophylaxis     Code Status Orders        Start     Ordered   03/11/16 1716  Full code   Continuous     03/11/16 1715    Code Status History    Date Active Date Inactive  Code Status Order ID Comments User Context   This patient has a current code status but no historical code status.           Consults None   DVT Prophylaxis  Lovenox    Lab Results  Component Value Date   PLT 134* 03/12/2016     Time Spent in minutes  35 minutes Greater than 50% of time spent in care coordination and counseling patient regarding the condition and plan of care.   Auburn Bilberry M.D on 03/12/2016 at 8:29 AM  Between 7am to 6pm - Pager - [phone]  After 6pm go to [email or web address] - password [email or web address] [facility]  [facility]   Office  [phone]

## 2016-03-12 NOTE — Care Management Important Message (Signed)
Important Message  Patient Details  Name: Jermaine IbaCharles G Kapuscinski Sr. MRN: [id number] Date of Birth: 1929/08/25   Medicare Important Message Given:  Yes    Marily MemosLisa M Nathian Stencil, RN 03/12/2016, 12:13 PM

## 2016-03-12 NOTE — Consult Note (Signed)
[facility]  CARDIOLOGY CONSULT NOTE  Patient ID: Jermaine Ibaharles G Vida Sr. MRN: [id number] DOB/AGE: Jan 29, 1929 82 y.o.  Admit date: 03/11/2016 Referring Physician Allena KatzPatel Primary Physician [facility] Primary Cardiologist Gwen PoundsKowalski Reason for Consultation congestive heart failure  HPI: 82 year old gentleman referred for evaluation of congestive heart failure. The patient has known coronary artery disease, status post CABG 2008, with known ischemic cardiomyopathy with LV ejection fraction of 20% on most recent echocardiogram. The patient has known complete heart block status post dual-chamber pacemaker. The patient reports that his shortness of breath started around 10/2015. Five days prior to admission the patient started to develop worsening shortness of breath with orthopnea with minimal peripheral edema. [facility] where he was treated with intravenous furosemide, with diuresis and clinical improvement. The patient was admitted to telemetry where name clinically stable, without chest pain, with negative troponin. ECG reveals atrial sensing with ventricular pacing.  Review of systems complete and found to be negative unless listed above     Past Medical History  Diagnosis Date  . Hypertension   . Diabetes mellitus without complication (HCC)   . Parkinson's disease (HCC) 2008  . CHF (congestive heart failure) (HCC)   . LVH (left ventricular hypertrophy)   . OSA (obstructive sleep apnea)     Past Surgical History  Procedure Laterality Date  . 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 Pacemaker placement N/A 2016    Prescriptions prior to admission  Medication Sig Dispense Refill Last Dose  . allopurinol (ZYLOPRIM) 300 MG tablet Take 300 mg by mouth daily.    03/11/2016 at am  . aspirin 325 MG tablet Take 325 mg by mouth at bedtime.   03/10/2016 at 2100  . carbidopa-levodopa (SINEMET IR) 25-250 MG tablet Take 2 tablets by mouth 2 (two) times daily.   03/11/2016  at am  . carvedilol (COREG) 3.125 MG tablet Take 3.125 mg by mouth 2 (two) times daily with a meal.   03/11/2016 at 0915  . cholecalciferol (VITAMIN D) 1000 UNITS tablet Take 1,000 Units by mouth daily.   03/11/2016 at am  . docusate sodium (COLACE) 100 MG capsule Take 100 mg by mouth every other day.   03/06/2016 at am  . doxazosin (CARDURA) 8 MG tablet Take 8 mg by mouth daily.    03/11/2016 at am  . lisinopril (PRINIVIL,ZESTRIL) 20 MG tablet Take 20 mg by mouth daily.   03/11/2016 at am  . Melatonin 5 MG TABS Take 5 mg by mouth at bedtime.    03/10/2016 at pm  . potassium chloride SA (K-DUR,KLOR-CON) 20 MEQ tablet Take 20 mEq by mouth daily.    03/11/2016 at am  . pravastatin (PRAVACHOL) 20 MG tablet Take 20 mg by mouth at bedtime.    03/10/2016 at pm  . rOPINIRole (REQUIP) 1 MG tablet Take 1 mg by mouth 2 (two) times daily.    03/11/2016 at am  . torsemide (DEMADEX) 20 MG tablet Take 20 mg by mouth daily.    03/11/2016 at am   Social History   Social History  . Marital Status: Married    Spouse Name: N/A  . Number of Children: N/A  . Years of Education: N/A   Occupational History  . Not on file.   Social History Main Topics  . Smoking status: Never Smoker   . Smokeless tobacco: Never Used  . Alcohol Use: No  . Drug Use: Not on file  . Sexual Activity:  Not on file   Other Topics Concern  . Not on file   Social History Narrative    Family History  Problem Relation Age of Onset  . Anemia Neg Hx   . Arrhythmia Neg Hx   . Asthma Neg Hx   . Clotting disorder Neg Hx   . Fainting Neg Hx   . Heart attack Neg Hx   . Heart disease Neg Hx   . Heart failure Neg Hx   . Hyperlipidemia Neg Hx   . Hypertension Neg Hx       Review of systems complete and found to be negative unless listed above      PHYSICAL EXAM  General: Well developed, well nourished, in no acute distress HEENT:  Normocephalic and atramatic Neck:  No JVD.  Lungs: Clear bilaterally to auscultation and  percussion. Heart: HRRR . Normal S1 and S2 without gallops or murmurs.  Abdomen: Bowel sounds are positive, abdomen soft and non-tender  Msk:  Back normal, normal gait. Normal strength and tone for age. Extremities: No clubbing, cyanosis or edema.   Neuro: Alert and oriented X 3. Psych:  Good affect, responds appropriately  Labs:   Lab Results  Component Value Date   WBC 7.6 03/12/2016   HGB 13.8 03/12/2016   HCT 41.0 03/12/2016   MCV 88.4 03/12/2016   PLT 134* 03/12/2016    Recent Labs Lab 03/12/16 0520  NA 140  K 3.5  CL 104  CO2 32  BUN 18  CREATININE 0.88  CALCIUM 8.9  GLUCOSE 93   Lab Results  Component Value Date   TROPONINI 0.03 03/11/2016   No results found for: CHOL No results found for: HDL No results found for: LDLCALC No results found for: TRIG No results found for: CHOLHDL No results found for: LDLDIRECT    Radiology: Dg Chest 2 View  03/11/2016  CLINICAL DATA:  Acute shortness of breath. EXAM: CHEST  2 VIEW COMPARISON:  December 17, 2015. FINDINGS: Stable cardiomegaly. Status post coronary artery bypass graft. Left-sided pacemaker is unchanged in position. No pneumothorax is noted. Mild central pulmonary vascular congestion is noted. Minimal bilateral pleural effusions are noted. Mild bibasilar interstitial densities are noted concerning for scarring or edema. Anterior osteophyte formation is noted in the lower thoracic spine. IMPRESSION: Mild bibasilar interstitial densities are noted concerning for scarring or edema. Minimal bilateral pleural effusions are noted Electronically Signed   By: Lupita Raider, M.D.   On: 03/11/2016 10:13    EKG: Atrial sensing with ventricular pacing  ASSESSMENT AND PLAN:   1. Acute on chronic systolic congestive heart failure, clinically improved after initial diuresis 2. Known coronary artery disease, status post CABG, with ischemic cardiomyopathy, without chest pain, and negative troponin 3. Complete heart block, status  post dual-chamber pacemaker  Recommendations  1. Agree with current therapy 2. Defer full dose anticoagulation 3. Continue diuresis 4. Closely monitor renal status 5. Review 2-D echocardiogram  Signed: Nell Gales MD,PhD, [facility] 03/12/2016, 8:29 AM

## 2016-03-13 LAB — BASIC METABOLIC PANEL
ANION GAP: 4 — AB (ref 5–15)
BUN: 26 mg/dL — ABNORMAL HIGH (ref 6–20)
CALCIUM: 9 mg/dL (ref 8.9–10.3)
CHLORIDE: 106 mmol/L (ref 101–111)
CO2: 31 mmol/L (ref 22–32)
Creatinine, Ser: 0.87 mg/dL (ref 0.61–1.24)
GFR calc non Af Amer: >60 mL/min (ref >60)
Glucose, Bld: 85 mg/dL (ref 65–99)
POTASSIUM: 3.5 mmol/L (ref 3.5–5.1)
Sodium: 141 mmol/L (ref 135–145)

## 2016-03-13 LAB — GLUCOSE, CAPILLARY: Glucose-Capillary: 89 mg/dL (ref 65–99)

## 2016-03-13 MED ORDER — POTASSIUM CHLORIDE CRYS ER 20 MEQ PO TBCR
40.0000 meq | EXTENDED_RELEASE_TABLET | Freq: Once | ORAL | Status: AC
  Administered 2016-03-13: 40 meq via ORAL
  Filled 2016-03-13: qty 2

## 2016-03-13 MED ORDER — POLYETHYLENE GLYCOL 3350 17 G PO PACK
17.0000 g | PACK | Freq: Every day | ORAL | Status: DC | PRN
  Administered 2016-03-13: 17 g via ORAL
  Filled 2016-03-13: qty 1

## 2016-03-13 MED ORDER — FUROSEMIDE 40 MG PO TABS
40.0000 mg | ORAL_TABLET | Freq: Two times a day (BID) | ORAL | Status: DC
  Administered 2016-03-13 – 2016-03-14 (×2): 40 mg via ORAL
  Filled 2016-03-13 (×2): qty 1

## 2016-03-13 NOTE — Progress Notes (Signed)
Patient ID: Jermaine Colaceharles G Cotrell Sr., male   DOB: 12-19-1928, 82 y.o.   MRN: [id number] [facility] PRO[facility]  Jermaine ColaceCharles G Piercey Sr. [id number] DOB: 12-19-1928 DOA: 03/11/2016 PCP: Lauro RegulusANDERSON,[name]., MD  HPI/Subjective: Patient feeling better. Nervous about going home today. Once to keep his Foley in.  Objective: Filed Vitals:   03/13/16 1027 03/13/16 1205  BP: 126/55 101/58  Pulse: 59 64  Temp:  98.4 F (36.9 C)  Resp:  18    Filed Weights   03/11/16 0932 03/11/16 1713 03/12/16 0439  Weight: 108.41 kg (239 lb) 99.791 kg (220 lb) 99.882 kg (220 lb 3.2 oz)    ROS: Review of Systems  Constitutional: Negative for fever and chills.  Eyes: Negative for blurred vision.  Respiratory: Positive for shortness of breath. Negative for cough.   Cardiovascular: Negative for chest pain.  Gastrointestinal: Negative for nausea, vomiting, diarrhea and constipation.  Genitourinary: Negative for dysuria.  Musculoskeletal: Negative for joint pain.  Neurological: Negative for dizziness and headaches.   Exam: Physical Exam  Constitutional: He is oriented to person, place, and time.  HENT:  Nose: No mucosal edema.  Mouth/Throat: No oropharyngeal exudate or posterior oropharyngeal edema.  Eyes: Conjunctivae, EOM and lids are normal. Pupils are equal, round, and reactive to light.  Neck: No JVD present. Carotid bruit is not present. No edema present. No thyroid mass and no thyromegaly present.  Cardiovascular: S1 normal and S2 normal.  Exam reveals no gallop.   No murmur heard. Pulses:      Dorsalis pedis pulses are 2+ on the right side, and 2+ on the left side.  Respiratory: No respiratory distress. He has no wheezes. He has no rhonchi. He has rales in the right lower field.  GI: Soft. Bowel sounds are normal. There is no tenderness.  Musculoskeletal:       Right ankle: He exhibits swelling.       Left ankle: He exhibits swelling.  Lymphadenopathy:    He has no cervical  adenopathy.  Neurological: He is alert and oriented to person, place, and time. No cranial nerve deficit.  Skin: Skin is warm. No rash noted. Nails show no clubbing.  Psychiatric: He has a normal mood and affect.      Data Reviewed: Basic Metabolic Panel:  Recent Labs Lab 03/11/16 1124 03/12/16 0520 03/13/16 0458  NA 138 140 141  K 4.1 3.5 3.5  CL 107 104 106  CO2 26 32 31  GLUCOSE 110* 93 85  BUN 17 18 26*  CREATININE 0.75 0.88 0.87  CALCIUM 8.6* 8.9 9.0   CBC:  Recent Labs Lab 03/11/16 1124 03/12/16 0520  WBC 7.3 7.6  NEUTROABS 6.2  --   HGB 12.7* 13.8  HCT 38.2* 41.0  MCV 87.0 88.4  PLT 130* 134*   Cardiac Enzymes:  Recent Labs Lab 03/11/16 1124  TROPONINI 0.03   BNP (last 3 results)  Recent Labs  12/17/15 1944 03/11/16 1124  BNP 964.0* 795.0*    Scheduled Meds: . allopurinol  300 mg Oral Daily  . aspirin  325 mg Oral QHS  . carbidopa-levodopa  2 tablet Oral BID  . carvedilol  3.125 mg Oral BID WC  . cholecalciferol  1,000 Units Oral Daily  . docusate sodium  100 mg Oral BID  . doxazosin  8 mg Oral Daily  . enoxaparin (LOVENOX) injection  40 mg Subcutaneous Q24H  . furosemide  40 mg Oral BID  . lisinopril  20 mg Oral Daily  .  potassium chloride SA  20 mEq Oral Daily  . pravastatin  20 mg Oral QHS  . rOPINIRole  1 mg Oral BID  . sodium chloride flush  3 mL Intravenous Q12H  . sodium chloride flush  3 mL Intravenous Q12H    Assessment/Plan:  1. Acute respiratory failure with hypoxia. Resolved. Now off oxygen. 2. Acute on chronic systolic congestive heart failure. Switch IV Lasix over to oral. Continue Coreg and lisinopril 3. Essential hypertension continue current medications 4. Parkinson's disease continue Sinemet 5. Type 2 diabetes without complication 6. History of gout on allopurinol 7. Hyperlipidemia unspecified on pravastatin 8. Restless leg syndrome on Requip  Code Status:     Code Status Orders        Start     Ordered    03/11/16 1716  Full code   Continuous     03/11/16 1715    Code Status History    Date Active Date Inactive Code Status Order ID Comments User Context   This patient has a current code status but no historical code status.     Family Communication: Permission to speak in front of friend at bedside Disposition Plan: Likely home tomorrow  Consultants:  Cardiology  Time spent: 25 minutes  Alford Highland  [facility]

## 2016-03-13 NOTE — Progress Notes (Addendum)
Pt states he isn't sure when his last BM was, but it has been a couple of days; he feels constipated and is requesting miralax. Okay to order per Dr. Renae GlossWieting.

## 2016-03-13 NOTE — Progress Notes (Signed)
[facility]  SUBJECTIVE: I feel better   Filed Vitals:   03/12/16 1934 03/12/16 1935 03/13/16 0516 03/13/16 0816  BP: 128/113 144/77 131/62 137/62  Pulse: 138 61 64 61  Temp: 98.1 F (36.7 C)  98.2 F (36.8 C)   TempSrc: Oral  Oral   Resp: 21  18   Height:      Weight:      SpO2: 96%  96% 95%     Intake/Output Summary (Last 24 hours) at 03/13/16 1610 Last data filed at 03/13/16 0735  Gross per 24 hour  Intake    120 ml  Output   1800 ml  Net  -1680 ml      PHYSICAL EXAM  General: Well developed, well nourished, in no acute distress HEENT:  Normocephalic and atramatic Neck:  No JVD.  Lungs: Clear bilaterally to auscultation and percussion. Heart: HRRR . Normal S1 and S2 without gallops or murmurs.  Abdomen: Bowel sounds are positive, abdomen soft and non-tender  Msk:  Back normal, normal gait. Normal strength and tone for age. Extremities: No clubbing, cyanosis or edema.   Neuro: Alert and oriented X 3. Psych:  Good affect, responds appropriately   LABS: Basic Metabolic Panel:  Recent Labs  96/04/54 0520 03/13/16 0458  NA 140 141  K 3.5 3.5  CL 104 106  CO2 32 31  GLUCOSE 93 85  BUN 18 26*  CREATININE 0.88 0.87  CALCIUM 8.9 9.0   Liver Function Tests: No results for input(s): AST, ALT, ALKPHOS, BILITOT, PROT, ALBUMIN in the last 72 hours. No results for input(s): LIPASE, AMYLASE in the last 72 hours. CBC:  Recent Labs  03/11/16 1124 03/12/16 0520  WBC 7.3 7.6  NEUTROABS 6.2  --   HGB 12.7* 13.8  HCT 38.2* 41.0  MCV 87.0 88.4  PLT 130* 134*   Cardiac Enzymes:  Recent Labs  03/11/16 1124  TROPONINI 0.03   BNP: Invalid input(s): POCBNP D-Dimer: No results for input(s): DDIMER in the last 72 hours. Hemoglobin A1C: No results for input(s): HGBA1C in the last 72 hours. Fasting Lipid Panel: No results for input(s): CHOL, HDL, LDLCALC, TRIG, CHOLHDL, LDLDIRECT in the last 72 hours. Thyroid Function Tests:  Recent Labs   03/12/16 0520  TSH 2.198   Anemia Panel: No results for input(s): VITAMINB12, FOLATE, FERRITIN, TIBC, IRON, RETICCTPCT in the last 72 hours.  Dg Chest 2 View  03/11/2016  CLINICAL DATA:  Acute shortness of breath. EXAM: CHEST  2 VIEW COMPARISON:  December 17, 2015. FINDINGS: Stable cardiomegaly. Status post coronary artery bypass graft. Left-sided pacemaker is unchanged in position. No pneumothorax is noted. Mild central pulmonary vascular congestion is noted. Minimal bilateral pleural effusions are noted. Mild bibasilar interstitial densities are noted concerning for scarring or edema. Anterior osteophyte formation is noted in the lower thoracic spine. IMPRESSION: Mild bibasilar interstitial densities are noted concerning for scarring or edema. Minimal bilateral pleural effusions are noted Electronically Signed   By: Lupita Raider, M.D.   On: 03/11/2016 10:13     Echo   TELEMETRY: Atrial sensing with ventricular pacing:  ASSESSMENT AND PLAN:  Active Problems:   Acute on chronic systolic (congestive) heart failure (HCC)    1. Acute on chronic systolic congestive heart failure, improved after diuresis 2. Known CAD, status post CABG, with ischemic cardiomyopathy, without chest pain, and negative troponin 3. Dual-chamber pacemaker for complete heart block, with appropriate pacing  Recommendations  1. Agree with overall current therapy 2. Continue diuresis  3. Closely monitor renal status 4. Review 2-D echocardiogram    Fernie Grimm,ALEXANMarcina MillardER, MD, PhD, [facility] 03/13/2016 9:07 AM

## 2016-03-14 LAB — GLUCOSE, CAPILLARY
GLUCOSE-CAPILLARY: 102 mg/dL — AB (ref 65–99)
Glucose-Capillary: 93 mg/dL (ref 65–99)

## 2016-03-14 LAB — BASIC METABOLIC PANEL
Anion gap: 7 (ref 5–15)
BUN: 31 mg/dL — AB (ref 6–20)
CO2: 26 mmol/L (ref 22–32)
CREATININE: 0.84 mg/dL (ref 0.61–1.24)
Calcium: 9 mg/dL (ref 8.9–10.3)
Chloride: 106 mmol/L (ref 101–111)
Glucose, Bld: 91 mg/dL (ref 65–99)
POTASSIUM: 3.7 mmol/L (ref 3.5–5.1)
SODIUM: 139 mmol/L (ref 135–145)

## 2016-03-14 MED ORDER — FUROSEMIDE 40 MG PO TABS: ORAL_TABLET | ORAL | Status: DC

## 2016-03-14 NOTE — Progress Notes (Signed)
Pt d/c home; d/c instructions reviewed w/ pt; pt understanding was verbalized; IV removed catheter in tact, gauze dressing applied; all pt questions answered; central tele notified, tele d/c;  pt left unit via wheelchair accompanied by staff 

## 2016-03-14 NOTE — Care Management Note (Signed)
Case Management Note  Patient Details  Name: Earlyne IbaCharles G Beitler Sr. MRN: [id number] Date of Birth: [birth year]  Subjective/Objective:      Contacted Jaime at [facility] about whether a hospital bed which was ordered for [name] on 03/12/16 has been delivered. Marijean NiemannJaime reported that [facility] had been unable to contact anyone to be at [name] residence to unlock it for them to get in. [name] was provided with the phone number to [facility] to call as soon as he gets home today and request that they deliver his hospital bed today. Mr Rudge agreed with this plan. This Clinical research associatewriter left a phone message for Mr Hall Busingark's daughter requesting a call back.            Action/Plan:   Expected Discharge Date:                  Expected Discharge Plan:  Home/Self Care  In-House Referral:     Discharge planning Services  CM Consult  Post Acute Care Choice:  Home Health Choice offered to:  Patient  DME Arranged:    DME Agency:     HH Arranged:  Patient Refused HH Agency:     Status of Service:  In process, will continue to follow  Medicare Important Message Given:  Yes Date Medicare IM Given:    Medicare IM give by:    Date Additional Medicare IM Given:    Additional Medicare Important Message give by:     If discussed at Long Length of Stay Meetings, dates discussed:    Additional Comments:  Kailah Pennel A, RN 03/14/2016, 10:35 AM

## 2016-03-14 NOTE — Discharge Instructions (Addendum)
Heart Failure Clinic appointment on March 22, 2016 at 10:00am with Clarisa Kindred, FNP. Please call [phone] to reschedule.  Heart Failure Heart failure means your heart has trouble pumping blood. This makes it hard for your body to work well. Heart failure is usually a long-term (chronic) condition. You must take good care of yourself and follow your doctor's treatment plan. HOME CARE  Take your heart medicine as told by your doctor.  Do not stop taking medicine unless your doctor tells you to.  Do not skip any dose of medicine.  Refill your medicines before they run out.  Take other medicines only as told by your doctor or pharmacist.  Stay active if told by your doctor. The elderly and people with severe heart failure should talk with a doctor about physical activity.  Eat heart-healthy foods. Choose foods that are without trans fat and are low in saturated fat, cholesterol, and salt (sodium). This includes fresh or frozen fruits and vegetables, fish, lean meats, fat-free or low-fat dairy foods, whole grains, and high-fiber foods. Lentils and dried peas and beans (legumes) are also good choices.  Limit salt if told by your doctor.  Cook in a healthy way. Roast, grill, broil, bake, poach, steam, or stir-fry foods.  Limit fluids as told by your doctor.  Weigh yourself every morning. Do this after you pee (urinate) and before you eat breakfast. Write down your weight to give to your doctor.  Take your blood pressure and write it down if your doctor tells you to.  Ask your doctor how to check your pulse. Check your pulse as told.  Lose weight if told by your doctor.  Stop smoking or chewing tobacco. Do not use gum or patches that help you quit without your doctor's approval.  Schedule and go to doctor visits as told.  Nonpregnant women should have no more than 1 drink a day. Men should have no more than 2 drinks a day. Talk to your doctor about drinking alcohol.  Stop illegal  drug use.  Stay current with shots (immunizations).  Manage your health conditions as told by your doctor.  Learn to manage your stress.  Rest when you are tired.  If it is really hot outside:  Avoid intense activities.  Use air conditioning or fans, or get in a cooler place.  Avoid caffeine and alcohol.  Wear loose-fitting, lightweight, and light-colored clothing.  If it is really cold outside:  Avoid intense activities.  Layer your clothing.  Wear mittens or gloves, a hat, and a scarf when going outside.  Avoid alcohol.  Learn about heart failure and get support as needed.  Get help to maintain or improve your quality of life and your ability to care for yourself as needed. GET HELP IF:   You gain weight quickly.  You are more short of breath than usual.  You cannot do your normal activities.  You tire easily.  You cough more than normal, especially with activity.  You have any or more puffiness (swelling) in areas such as your hands, feet, ankles, or belly (abdomen).  You cannot sleep because it is hard to breathe.  You feel like your heart is beating fast (palpitations).  You get dizzy or light-headed when you stand up. GET HELP RIGHT AWAY IF:   You have trouble breathing.  There is a change in mental status, such as becoming less alert or not being able to focus.  You have chest pain or discomfort.  You faint. MAKE  SURE YOU:   Understand these instructions.  Will watch your condition.  Will get help right away if you are not doing well or get worse.   This information is not intended to replace advice given to you by your health care provider. Make sure you discuss any questions you have with your health care provider.   Document Released: 09/07/2008 Document Revised: 12/20/2014 Document Reviewed: 01/15/2013 Elsevier Interactive Patient Education 2016 [employer]   Follow all MD discharge instructions. Take all medications as  prescribed. Keep all follow up appointments. If your symptoms return, call your doctor. If you experience any new symptoms that are of concern to you or that are bothersome to you, call your doctor. For all questions and/or concerns, call your doctor.   If you have a medical emergency, call 911

## 2016-03-14 NOTE — Discharge Summary (Signed)
[facility] [place] [facility]   PATIENT NAME: Jermaine [name]    MR#:  [id number]  DATE OF BIRTH:  [birth year]  DATE OF ADMISSION:  03/11/2016 ADMITTING PHYSICIAN: Ramonita Lab, MD  DATE OF DISCHARGE: 03/14/2016  1:14 PM  PRIMARY CARE PHYSICIAN: Lauro Regulus., MD    ADMISSION DIAGNOSIS:  Acute on chronic congestive heart failure, unspecified congestive heart failure type (HCC) [I50.9]  DISCHARGE DIAGNOSIS:  Active Problems:   Acute on chronic systolic (congestive) heart failure (HCC)   SECONDARY DIAGNOSIS:   Past Medical History  Diagnosis Date  . Hypertension   . Diabetes mellitus without complication (HCC)   . Parkinson's disease (HCC) 2008  . CHF (congestive heart failure) (HCC)   . LVH (left ventricular hypertrophy)   . OSA (obstructive sleep apnea)     HOSPITAL COURSE:   1. Acute on chronic systolic congestive heart failure. Patient was diuresed with IV Lasix with good response. He is 9 kg lighter than on presentation. Patient was hesitant on twice a day Lasix dosing upon discharge and wanted to do once a day. I advised him he must weigh himself on a daily basis and if he gains more than 3 pounds in a day or 5 pounds in a week he must take that second dose of Lasix. Continue Coreg and lisinopril. 2. Acute respiratory failure with hypoxia. This has resolved. He is breathing comfortably on room air. 3. Essential hypertension continue usual medications 4. Parkinson's disease continue Sinemet 5. Type 2 diabetes without complication 6. History of gout on allopurinol 7. Hyperlipidemia unspecified on pravastatin 8. 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 (If you gain 3 lbs in one day or 5 lbs in a week can take lasix twice a day), Print      CONTINUE these medications which have NOT CHANGED   Details  allopurinol (ZYLOPRIM) 300 MG tablet Take 300 mg by mouth daily. , Until Discontinued, Historical Med    aspirin 325 MG tablet Take 325 mg by mouth at bedtime., Until Discontinued, Historical Med    carbidopa-levodopa (SINEMET IR) 25-250 MG tablet Take 2 tablets by mouth 2 (two) times daily., Until Discontinued, Historical Med    carvedilol (COREG) 3.125 MG tablet Take 3.125 mg by mouth 2 (two) times daily with a meal., Until Discontinued, Historical Med    cholecalciferol (VITAMIN D) 1000 UNITS tablet Take 1,000 Units by mouth daily., Until Discontinued, Historical Med    docusate sodium (COLACE) 100 MG capsule Take 100 mg by mouth every other day., Until Discontinued, Historical Med    doxazosin (CARDURA) 8 MG tablet Take 8 mg by mouth daily. , Until Discontinued, Historical Med    lisinopril (PRINIVIL,ZESTRIL) 20 MG tablet Take 20 mg by mouth daily., Until Discontinued, Historical Med    Melatonin 5 MG TABS Take 5 mg by mouth at bedtime. , Until Discontinued, Historical Med    potassium chloride SA (K-DUR,KLOR-CON) 20 MEQ tablet Take 20 mEq by mouth daily. , Until Discontinued, Historical Med    pravastatin (PRAVACHOL) 20 MG tablet Take 20 mg by mouth at bedtime. , Until Discontinued, Historical Med    rOPINIRole (REQUIP) 1 MG tablet  Take 1 mg by mouth 2 (two) times daily. , Until Discontinued, Historical Med      STOP taking these medications     torsemide (DEMADEX) 20 MG tablet          DISCHARGE INSTRUCTIONS:   Follow-up with Dr. Gwen PoundsKowalski one week Follow up at the CHF clinic  If you experience worsening of your admission symptoms, develop shortness of breath, life threatening emergency, suicidal or homicidal thoughts you must seek medical attention immediately by calling 911 or calling  your MD immediately  if symptoms less severe.  You Must read complete instructions/literature along with all the possible adverse reactions/side effects for all the Medicines you take and that have been prescribed to you. Take any new Medicines after you have completely understood and accept all the possible adverse reactions/side effects.   Please note  You were cared for by a hospitalist during your hospital stay. If you have any questions about your discharge medications or the care you received while you were in the hospital after you are discharged, you can call the unit and asked to speak with the hospitalist on call if the hospitalist that took care of you is not available. Once you are discharged, your primary care physician will handle any further medical issues. Please note that NO REFILLS for any discharge medications will be authorized once you are discharged, as it is imperative that you return to your primary care physician (or establish a relationship with a primary care physician if you do not have one) for your aftercare needs so that they can reassess your need for medications and monitor your lab values.    Today   CHIEF COMPLAINT:   Chief Complaint  Patient presents with  . Shortness of Breath    HISTORY OF PRESENT ILLNESS:  Jermaine [name]  is a 82 y.o. male presented with shortness of breath   VITAL SIGNS:  Blood pressure 111/77, pulse 62, temperature 97.6 F (36.4 C), temperature source Oral, resp. rate 20, height 5\' 8"  (1.727 m), weight 97.705 kg (215 lb 6.4 oz), SpO2 94 %.  I/O:   Intake/Output Summary (Last 24 hours) at 03/14/16 1548 Last data filed at 03/14/16 1245  Gross per 24 hour  Intake    480 ml  Output   1000 ml  Net   -520 ml    PHYSICAL EXAMINATION:  GENERAL:  82 y.o.-year-old patient lying in the bed with no acute distress.  EYES: Pupils equal, round, reactive to light and accommodation. No scleral icterus. Extraocular muscles intact.  HEENT:  Head atraumatic, normocephalic. Oropharynx and nasopharynx clear.  NECK:  Supple, no jugular venous distention. No thyroid enlargement, no tenderness.  LUNGS: Normal breath sounds bilaterally, no wheezing, rales,rhonchi or crepitation. No use of accessory muscles of respiration.  CARDIOVASCULAR: S1, S2 normal. No murmurs, rubs, or gallops.  ABDOMEN: Soft, non-tender, non-distended. Bowel sounds present. No organomegaly or mass.  EXTREMITIES: No pedal edema, cyanosis, or clubbing.  NEUROLOGIC: Cranial nerves II through XII are intact. Muscle strength 5/5 in all extremities. Sensation intact. Gait not checked.  PSYCHIATRIC: The patient is alert and oriented x 3.  SKIN: No obvious rash, lesion, or ulcer.   DATA REVIEW:   CBC  Recent Labs Lab 03/12/16 0520  WBC 7.6  HGB 13.8  HCT 41.0  PLT 134*    Chemistries   Recent Labs Lab 03/14/16 0427  NA 139  K 3.7  CL 106  CO2 26  GLUCOSE 91  BUN 31*  CREATININE 0.84  CALCIUM 9.0    Cardiac Enzymes  Recent Labs Lab 03/11/16 1124  TROPONINI 0.03     Management plans discussed with the patient, and he is in agreement.  CODE STATUS:     Code Status Orders        Start     Ordered   03/11/16 1716  Full code   Continuous     03/11/16 1715    Code Status History    Date Active Date Inactive Code Status Order ID Comments User Context   This patient has a current code status but no historical code status.      TOTAL TIME TAKING CARE OF THIS PATIENT: 35 minutes.    Alford Highland M.D on 03/14/2016 at 3:48 PM  Between 7am to 6pm - Pager - [phone]  After 6pm go to [email or web address] - password [email or web address] [facility]  [place] [facility]  Office  [phone]  CC: Primary care physician; Lauro Regulus., MD

## 2016-03-17 IMAGING — CT CT HEAD WITHOUT CONTRAST
2 of 3 series · 16 of 30 positions shown, 18 images · non-contrast
Comparison: None.

CLINICAL DATA: Syncope, fall, Parkinson's disease

EXAM:
CT HEAD WITHOUT CONTRAST
TECHNIQUE: Contiguous axial images were obtained from the base of the skull
through the vertex without intravenous contrast.

[Series 2: soft tissue · axial · 0.43mm/px · z∈[-140,-20]mm · 8 of 32 slices shown, 10 images]
[im 4/32  brain]
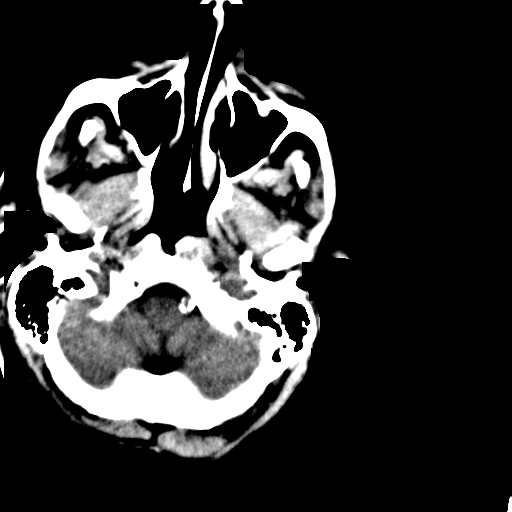
[im 4/32  bone]
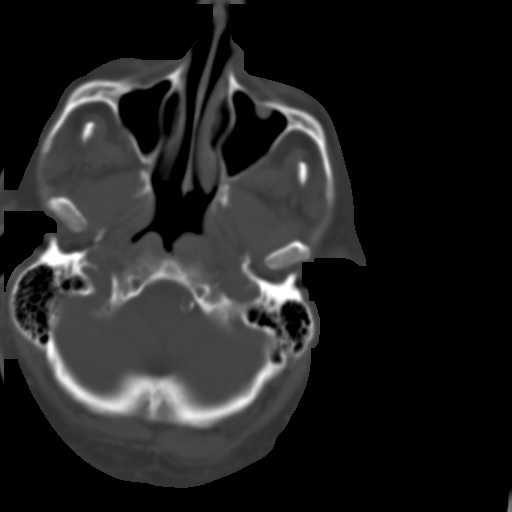
[im 7/32  brain]
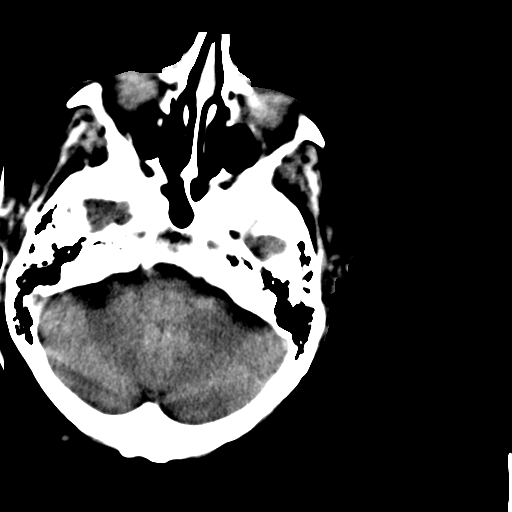
[im 11/32  brain]
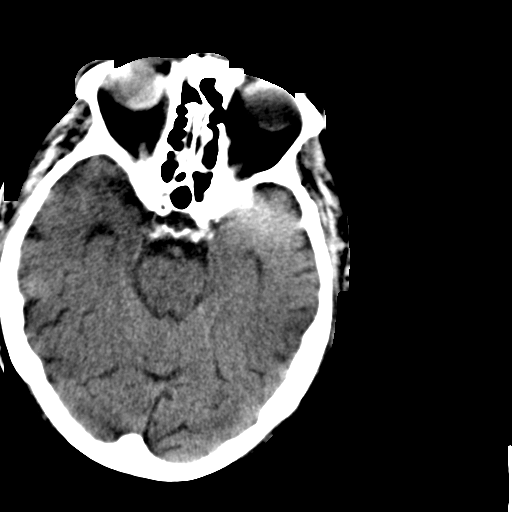
[im 14/32  brain]
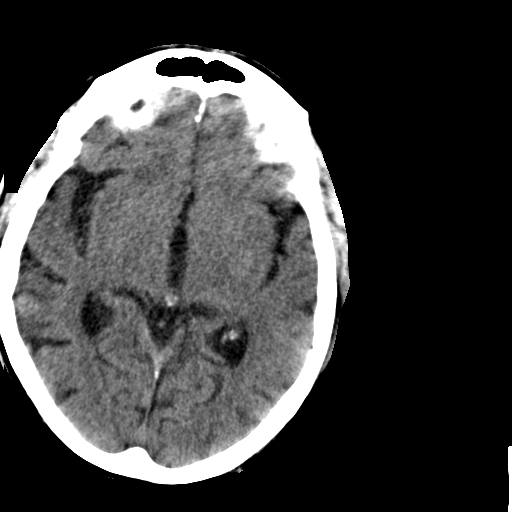
[im 18/32  brain]
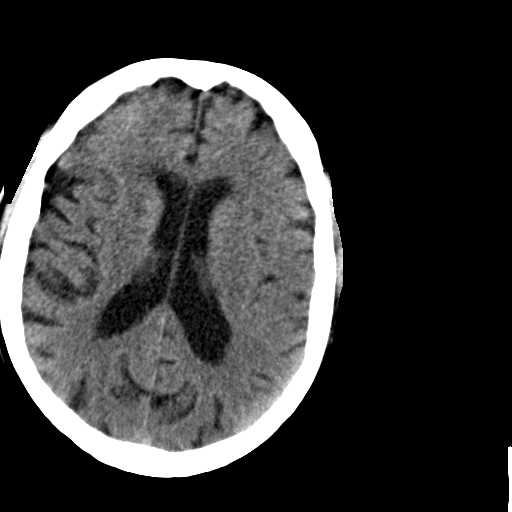
[im 18/32  bone]
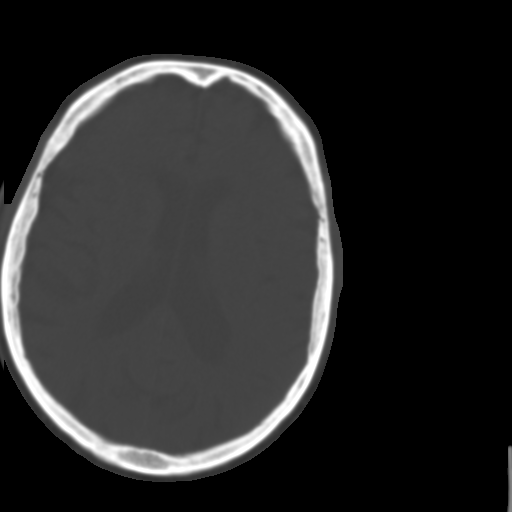
[im 21/32  brain]
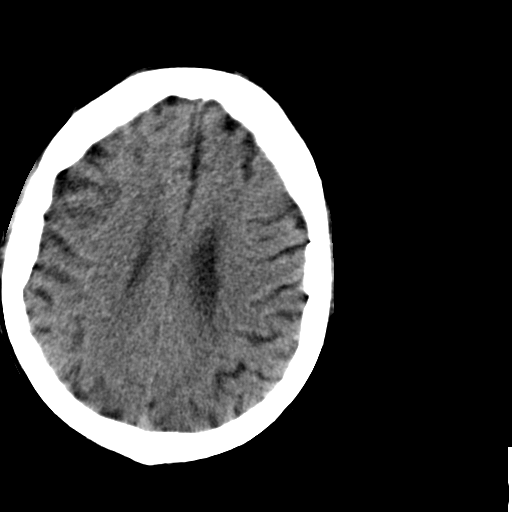
[im 25/32  brain]
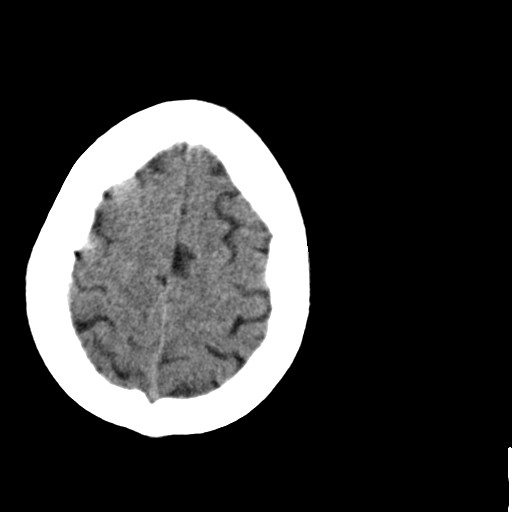
[im 28/32  brain]
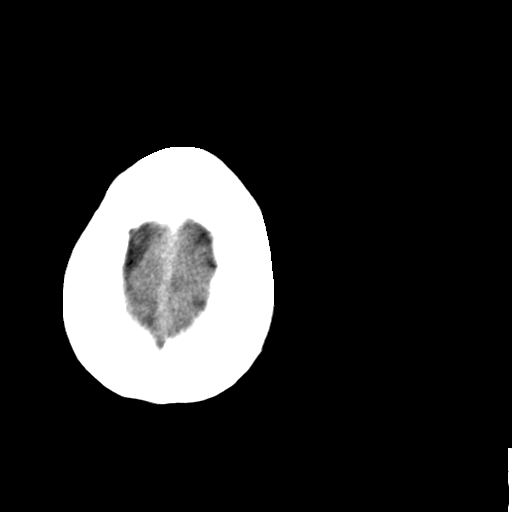

[Series 6: soft tissue--- · axial · 0.35mm/px · z∈[-98,+9]mm · 8 of 30 slices shown]
[im 4/30  brain]
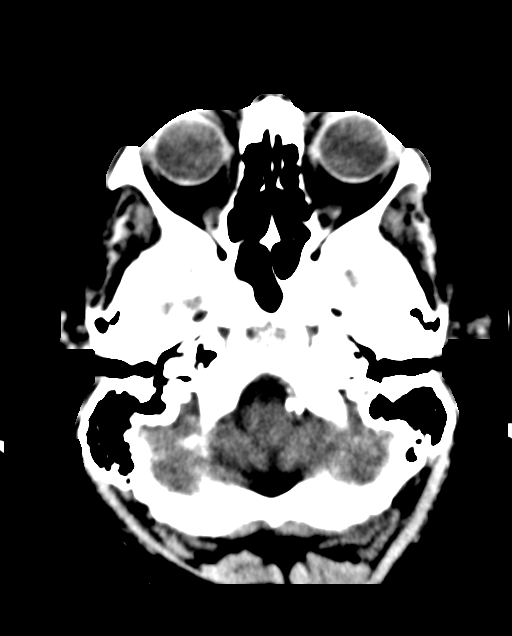
[im 7/30  brain]
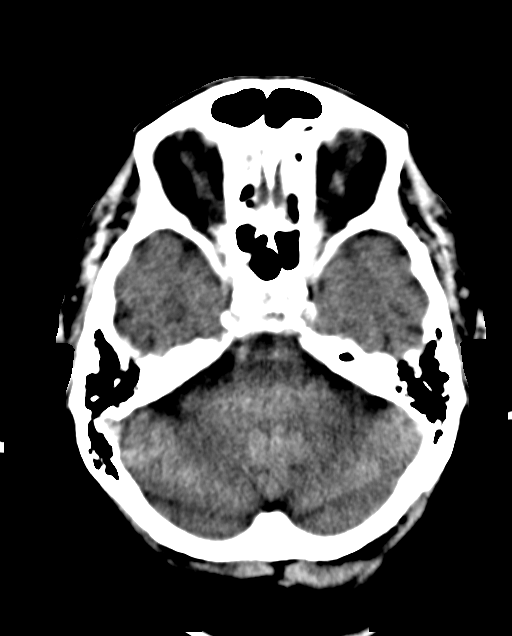
[im 10/30  brain]
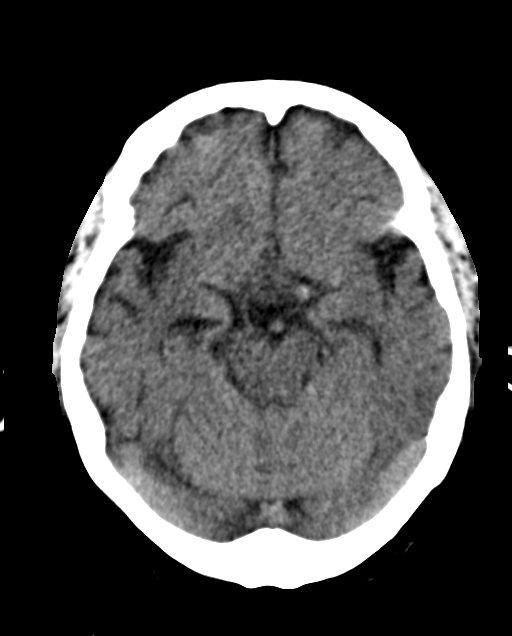
[im 13/30  brain]
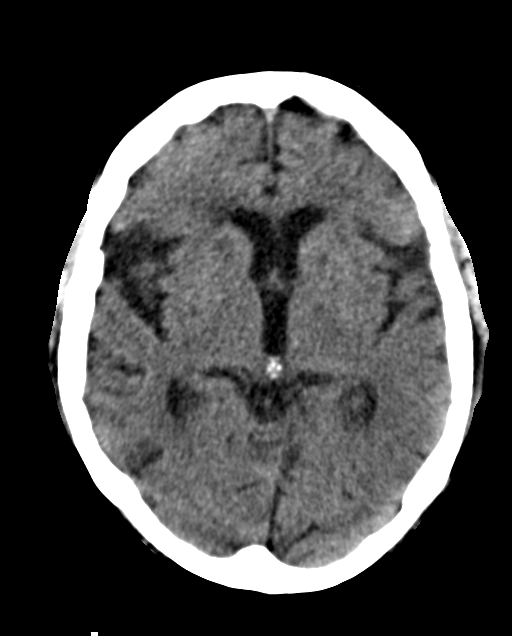
[im 17/30  brain]
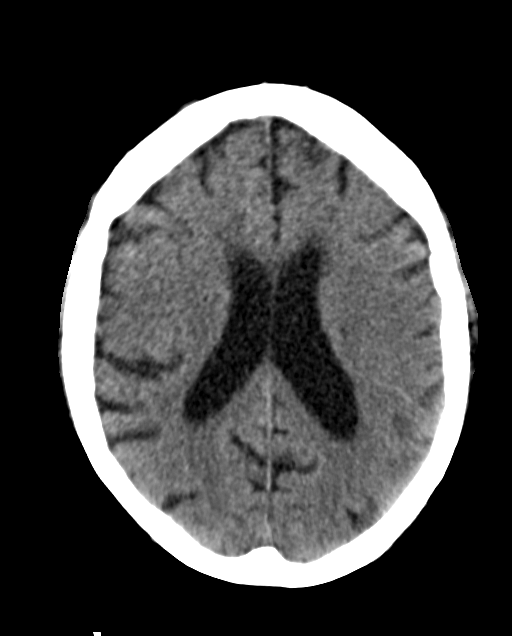
[im 20/30  brain]
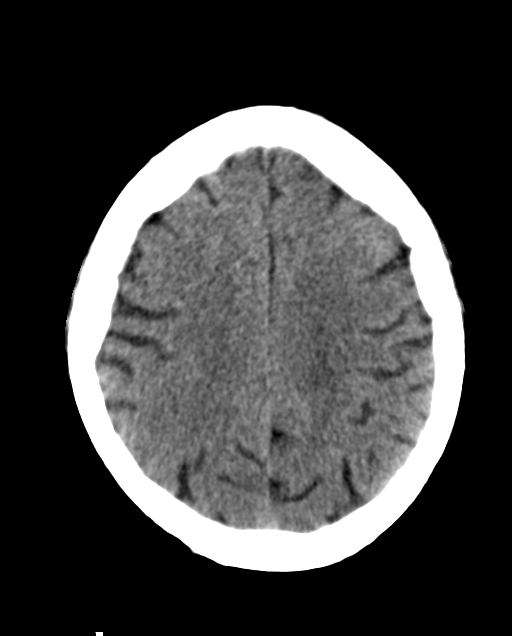
[im 23/30  brain]
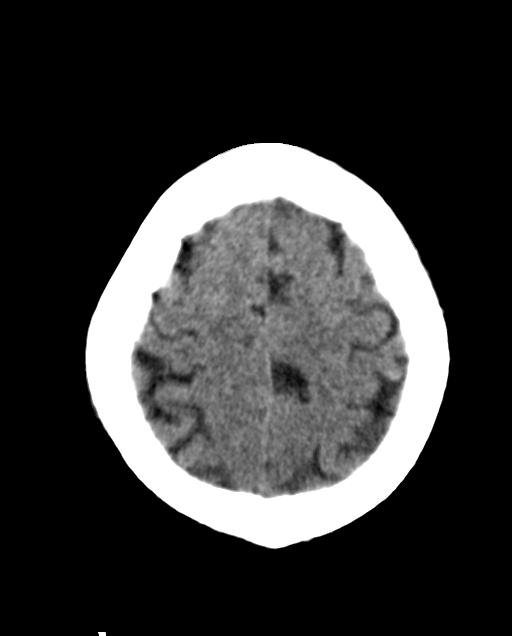
[im 26/30  brain]
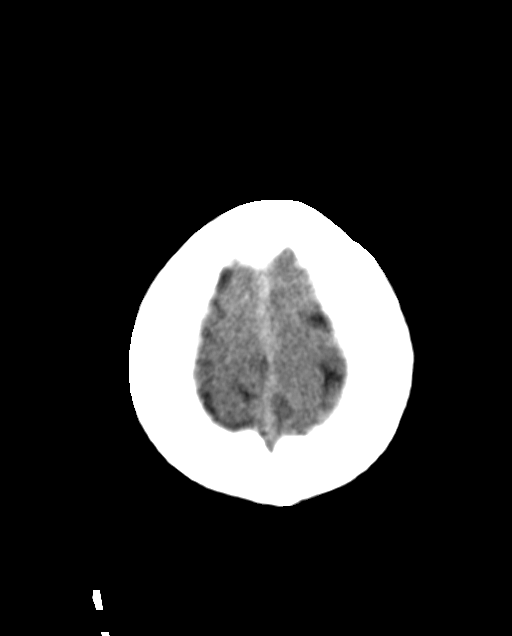

[16 of 30 positions shown; findings below may reference images not displayed]

FINDINGS: Motion degraded images.

No evidence of parenchymal hemorrhage or extra-axial fluid
collection. No mass lesion, mass effect, or midline shift.

No CT evidence of acute infarction.

Old right basal ganglia lacunar infarct.

Subcortical white matter and periventricular small vessel ischemic
changes. Intracranial atherosclerosis.

Global cortical atrophy.  No ventriculomegaly.

The visualized paranasal sinuses are essentially clear. The mastoid
air cells are unopacified.

No evidence of calvarial fracture.
IMPRESSION: No evidence of acute intracranial abnormality.

Old right basal ganglia lacunar infarct.

Atrophy with small vessel ischemic changes and intracranial
atherosclerosis.

## 2016-03-19 IMAGING — CR DG CHEST 2V
1 series · 3 of 3 positions shown · non-contrast
Comparison: Chest x-ray 12/24/2014.

CLINICAL DATA: 85-year-old male with history of syncope are
[REDACTED], with a fall onto is right-side. Multiple rib fractures.
Evaluate for pneumothorax.

EXAM:
CHEST  2 VIEW

[Series 1: dxr chest pa (or ap) and lateral · 0.14mm/px · 3 of 3 slices shown]
[im 1/3]
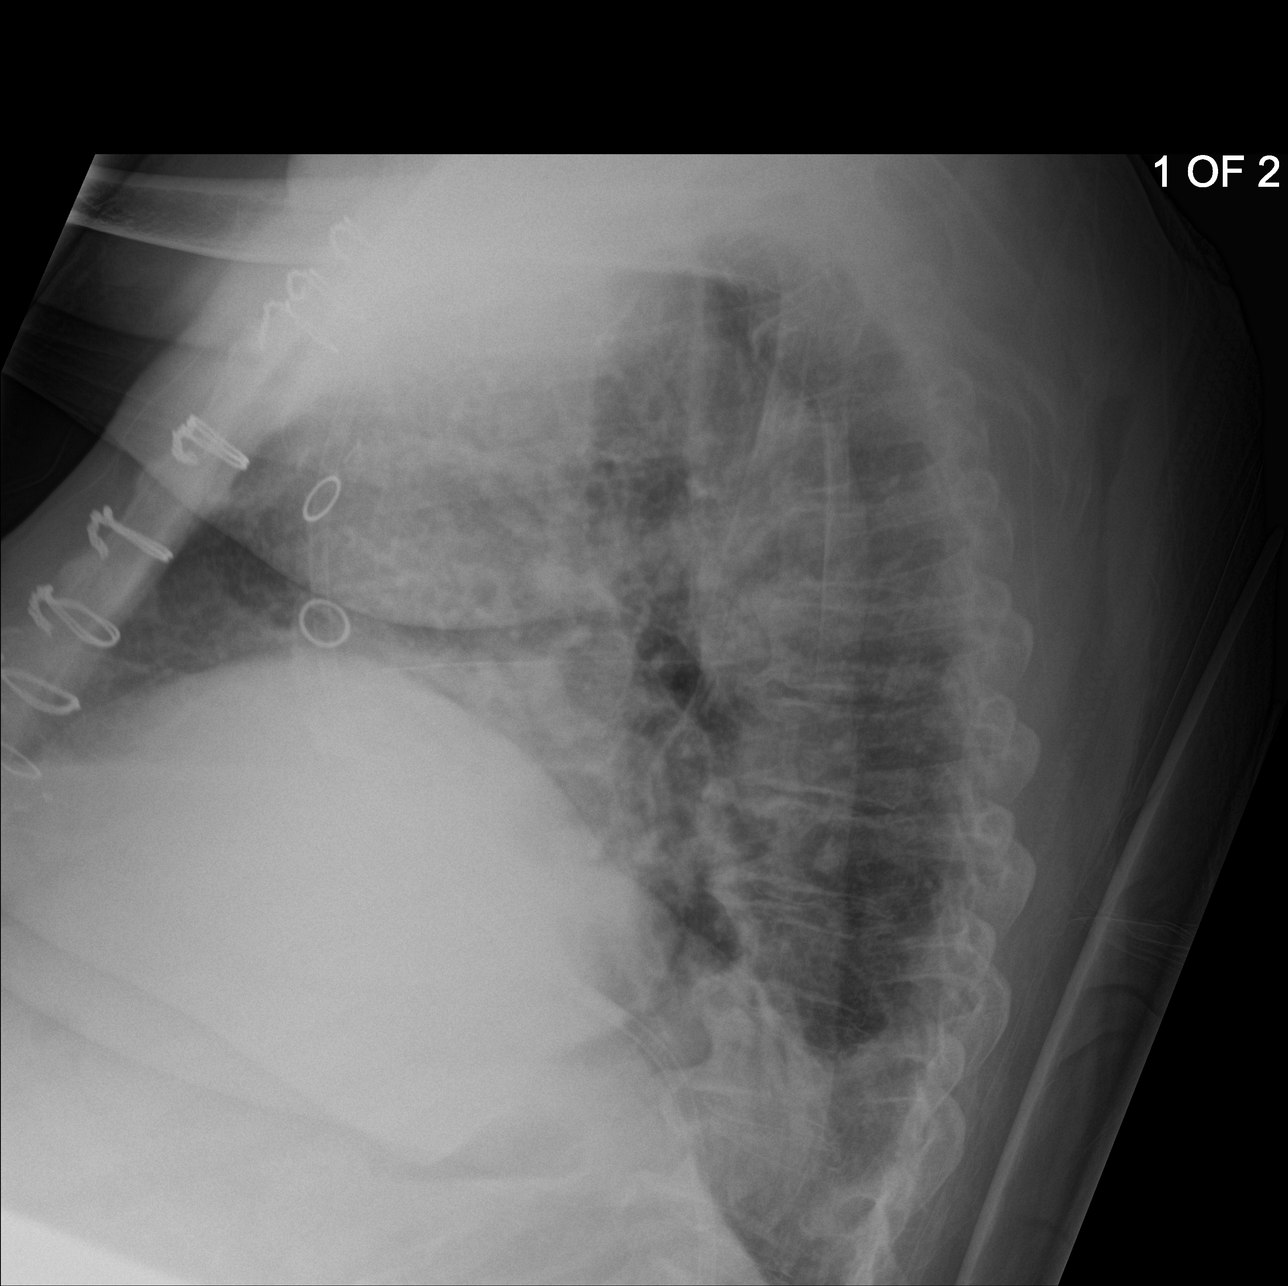
[im 2/3]
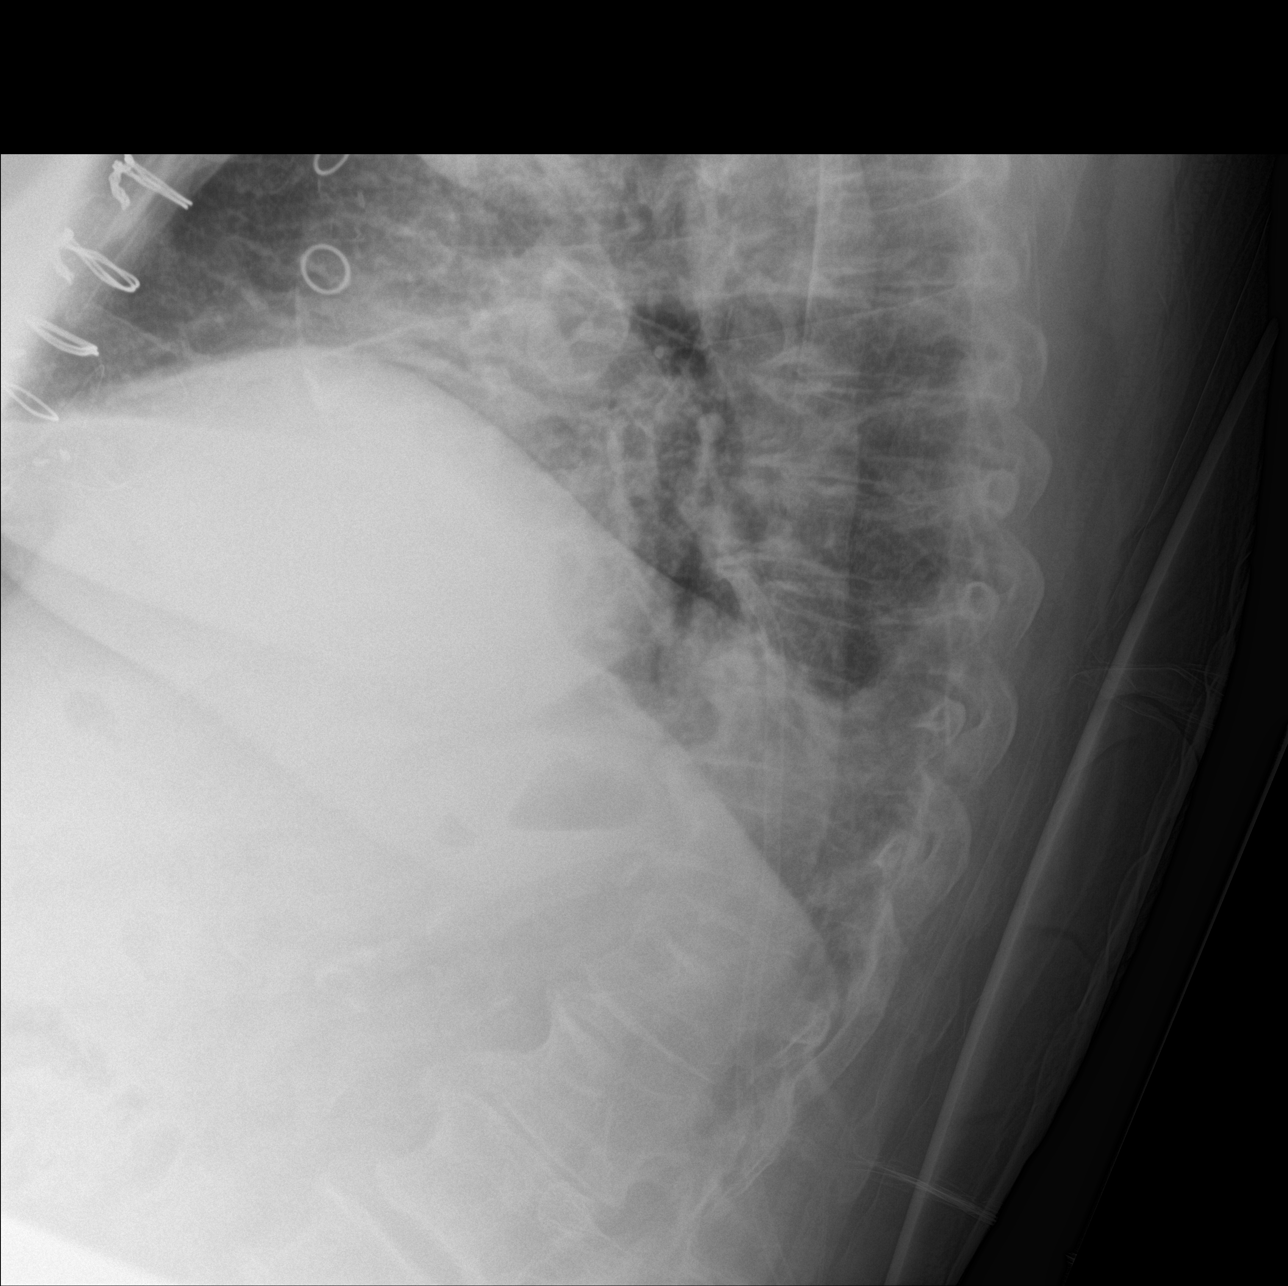
[im 3/3]
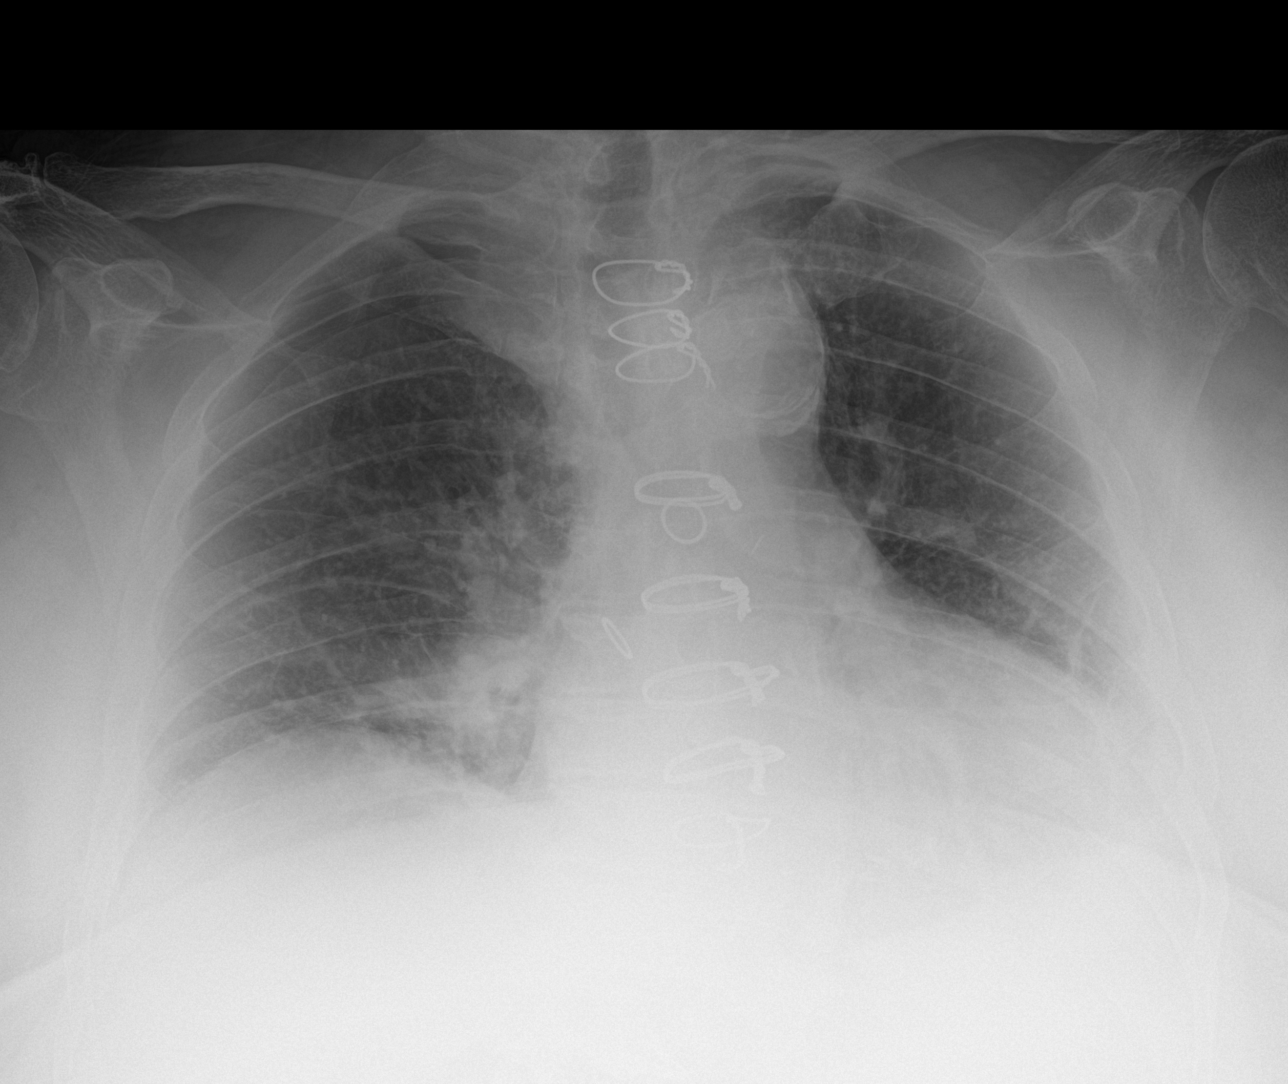

[3 of 3 positions shown; findings below may reference images not displayed]

FINDINGS: Lung volumes are low. Increasing right-sided pneumothorax, now
approximately 10-15% of the volume of the right hemithorax.
Bibasilar opacities favored to reflect worsening areas of
subsegmental atelectasis, although underlying airspace consolidation
is not excluded. No definite pleural effusions. Cephalization of the
pulmonary vasculature, cephalization of the pulmonary vasculature,
without frank pulmonary edema. Mild cardiomegaly. The patient is
rotated to the left on today's exam, resulting in distortion of the
mediastinal contours and reduced diagnostic sensitivity and
specificity for mediastinal pathology. Atherosclerosis in the
thoracic aorta. Status post median sternotomy for CABG. Multiple
lower right-sided lateral rib fractures again noted, better
demonstrated on recent CT examination.
IMPRESSION: 1. Multiple lower right lateral rib fractures (seventh through ninth
ribs laterally based on findings on CT scan), with enlarging small
to moderate right pleural effusion, currently 10-15% of the volume
of the right hemithorax. No chest tube identified at this time.
2. Low lung volumes with worsening bibasilar aeration, presumably
secondary to worsening subsegmental atelectasis.
3. Mild cardiomegaly with pulmonary venous congestion.
4. Atherosclerosis.
These results will be called to the ordering clinician or
representative by the Radiologist Assistant, and communication
documented in the PACS or zVision Dashboard.

## 2016-03-19 IMAGING — CR DG CHEST 1V PORT
1 series · 1 of 1 positions shown · non-contrast
Comparison: 12/26/2014

CLINICAL DATA: Subsequent evaluation right chest tube, pneumothorax
after rib fracture

EXAM:
PORTABLE CHEST - 1 VIEW

[ap]
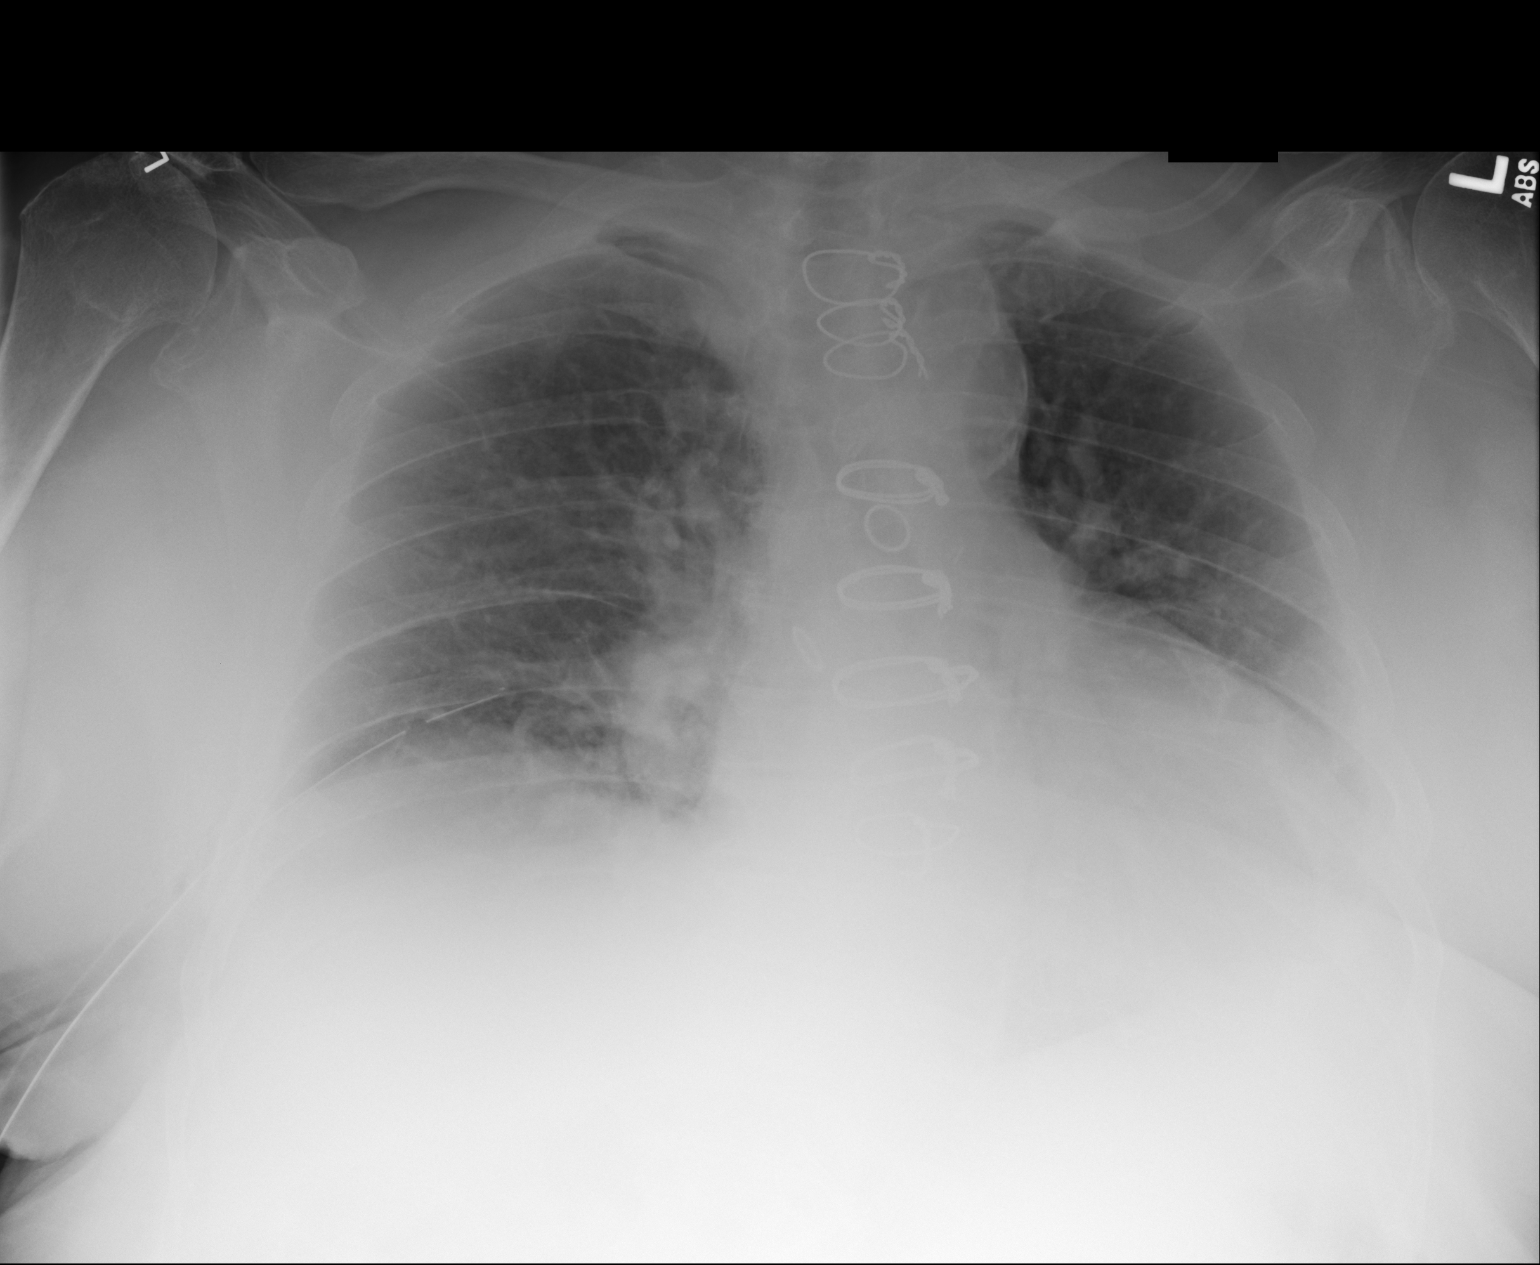

[1 of 1 positions shown; findings below may reference images not displayed]

FINDINGS: Right chest tube identified over the lower lobes. Trace residual
pneumothorax measuring about 2 mm. Otherwise unchanged appearance
with prominent aortic arch and cardiac silhouette with mild vascular
congestion.
IMPRESSION: Significant decrease in the size of right pneumothorax.

## 2016-03-22 ENCOUNTER — Encounter: Payer: Self-pay | Admitting: Family

## 2016-03-22 ENCOUNTER — Ambulatory Visit: Payer: Medicare Other | Attending: Family | Admitting: Family

## 2016-03-22 VITALS — BP 154/75 | HR 84 | Resp 18 | Ht 68.0 in | Wt 225.0 lb

## 2016-03-22 DIAGNOSIS — G20 Parkinson's disease: Secondary | ICD-10-CM | POA: Diagnosis not present

## 2016-03-22 DIAGNOSIS — Z9889 Other specified postprocedural states: Secondary | ICD-10-CM | POA: Diagnosis not present

## 2016-03-22 DIAGNOSIS — F329 Major depressive disorder, single episode, unspecified: Secondary | ICD-10-CM | POA: Insufficient documentation

## 2016-03-22 DIAGNOSIS — E119 Type 2 diabetes mellitus without complications: Secondary | ICD-10-CM | POA: Diagnosis not present

## 2016-03-22 DIAGNOSIS — I5022 Chronic systolic (congestive) heart failure: Secondary | ICD-10-CM | POA: Insufficient documentation

## 2016-03-22 DIAGNOSIS — Z7982 Long term (current) use of aspirin: Secondary | ICD-10-CM | POA: Diagnosis not present

## 2016-03-22 DIAGNOSIS — R5383 Other fatigue: Secondary | ICD-10-CM | POA: Insufficient documentation

## 2016-03-22 DIAGNOSIS — R0602 Shortness of breath: Secondary | ICD-10-CM | POA: Insufficient documentation

## 2016-03-22 DIAGNOSIS — G479 Sleep disorder, unspecified: Secondary | ICD-10-CM | POA: Insufficient documentation

## 2016-03-22 DIAGNOSIS — F32A Depression, unspecified: Secondary | ICD-10-CM

## 2016-03-22 DIAGNOSIS — G4733 Obstructive sleep apnea (adult) (pediatric): Secondary | ICD-10-CM | POA: Diagnosis not present

## 2016-03-22 DIAGNOSIS — I10 Essential (primary) hypertension: Secondary | ICD-10-CM | POA: Insufficient documentation

## 2016-03-22 DIAGNOSIS — I517 Cardiomegaly: Secondary | ICD-10-CM | POA: Diagnosis not present

## 2016-03-22 DIAGNOSIS — Z79899 Other long term (current) drug therapy: Secondary | ICD-10-CM | POA: Diagnosis not present

## 2016-03-22 NOTE — Patient Instructions (Signed)
Continue weighing daily and call for an overnight weight gain of > 2 pounds or a weekly weight gain of >5 pounds. 

## 2016-03-22 NOTE — Progress Notes (Signed)
Subjective:    Patient ID: Jermaine [name]., male    DOB: December 10, 1929, 82 y.o.   MRN: [id number]  Congestive Heart Failure Presents for follow-up visit. The disease course has been improving. Associated symptoms include fatigue and shortness of breath (better). Pertinent negatives include no abdominal pain, chest pain, chest pressure, edema, orthopnea (sleeping in a hospital bed now) or palpitations. The symptoms have been improving. Past treatments include ACE inhibitors, beta blockers and salt and fluid restriction. The treatment provided moderate relief. Compliance with prior treatments has been good. His past medical history is significant for DM and HTN. There is no history of chronic lung disease.  Hypertension This is a chronic problem. The current episode started more than 1 year ago. The problem is unchanged. The problem is controlled. Associated symptoms include shortness of breath (better). Pertinent negatives include no chest pain, headaches, neck pain, palpitations or peripheral edema. There are no associated agents to hypertension. Risk factors for coronary artery disease include male gender, sedentary lifestyle and diabetes mellitus. Past treatments include ACE inhibitors, beta blockers, diuretics and lifestyle changes. The current treatment provides significant improvement. Compliance problems include exercise.  Hypertensive end-organ damage includes heart failure.   Past Medical History  Diagnosis Date  . Hypertension   . Diabetes mellitus without complication (HCC)   . Parkinson's disease (HCC) 2008  . CHF (congestive heart failure) (HCC)   . LVH (left ventricular hypertrophy)   . OSA (obstructive sleep apnea)     Past Surgical History  Procedure Laterality Date  . 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 Pacemaker placement N/A 2016    Family History  Problem Relation Age of Onset  . Anemia Neg Hx   . Arrhythmia Neg Hx   . Asthma Neg Hx   . Clotting  disorder Neg Hx   . Fainting Neg Hx   . Heart attack Neg Hx   . Heart disease Neg Hx   . Heart failure Neg Hx   . Hyperlipidemia Neg Hx   . Hypertension Neg Hx     Social History  Substance Use Topics  . Smoking status: Never Smoker   . Smokeless tobacco: Never Used  . Alcohol Use: No    No Known Allergies  Prior to Admission medications   Medication Sig Start Date End Date Taking? Authorizing Provider  allopurinol (ZYLOPRIM) 300 MG tablet Take 150 mg by mouth daily.    Yes Historical Provider, MD  aspirin 325 MG tablet Take 325 mg by mouth at bedtime.   Yes Historical Provider, MD  carbidopa-levodopa (SINEMET IR) 25-250 MG tablet Take 2 tablets by mouth 2 (two) times daily.   Yes Historical Provider, MD  carvedilol (COREG) 3.125 MG tablet Take 3.125 mg by mouth 2 (two) times daily with a meal.   Yes Historical Provider, MD  cholecalciferol (VITAMIN D) 1000 UNITS tablet Take 1,000 Units by mouth daily.   Yes Historical Provider, MD  docusate sodium ([name]) 100 MG capsule Take 100 mg by mouth every other day.   Yes Historical Provider, MD  doxazosin (CARDURA) 8 MG tablet Take 4 mg by mouth daily.    Yes Historical Provider, MD  furosemide (LASIX) 40 MG tablet One tablet daily in morning. (If you gain 3 lbs in one day or 5 lbs in a week can take lasix twice a day) 03/14/16  Yes Alford Highland, MD  lisinopril (PRINIVIL,ZESTRIL) 20 MG tablet Take 20 mg by mouth daily.  Yes Historical Provider, MD  Melatonin 5 MG TABS Take 5 mg by mouth at bedtime.    Yes Historical Provider, MD  potassium chloride SA (K-DUR,KLOR-CON) 20 MEQ tablet Take 20 mEq by mouth daily.    Yes Historical Provider, MD  pravastatin (PRAVACHOL) 20 MG tablet Take 20 mg by mouth at bedtime.    Yes Historical Provider, MD  rOPINIRole (REQUIP) 1 MG tablet Take 1 mg by mouth 2 (two) times daily.    Yes Historical Provider, MD      Review of Systems  Constitutional: Positive for fatigue. Negative for appetite change.   HENT: Negative for congestion, postnasal drip and sore throat.   Eyes: Negative.   Respiratory: Positive for shortness of breath (better). Negative for cough, chest tightness and wheezing.   Cardiovascular: Negative for chest pain, palpitations and leg swelling.  Gastrointestinal: Negative for abdominal pain and abdominal distention.  Endocrine: Negative.   Genitourinary: Negative.   Musculoskeletal: Negative for back pain and neck pain.  Skin: Negative.   Allergic/Immunologic: Negative.   Neurological: Negative for dizziness, light-headedness and headaches.  Hematological: Negative for adenopathy. Does not bruise/bleed easily.  Psychiatric/Behavioral: Positive for sleep disturbance (due to new cpap machine) and dysphoric mood. The patient is not nervous/anxious.        Objective:   Physical Exam  Constitutional: He is oriented to person, place, and time. He appears well-developed and well-nourished.  HENT:  Head: Normocephalic and atraumatic.  Eyes: Conjunctivae are normal. Pupils are equal, round, and reactive to light.  Neck: Normal range of motion. Neck supple.  Cardiovascular: Normal rate and regular rhythm.   Pulmonary/Chest: Effort normal. He has no wheezes. He has no rales.  Abdominal: Soft. He exhibits no distension. There is no tenderness.  Musculoskeletal: He exhibits no edema or tenderness.  Neurological: He is alert and oriented to person, place, and time.  Skin: Skin is warm and dry.  Psychiatric: He has a normal mood and affect. His behavior is normal. Thought content normal.  Nursing note and vitals reviewed.   BP 154/75 mmHg  Pulse 84  Resp 18  Ht  (1.727 m)  Wt 225 lb (102.059 kg)  BMI 34.22 kg/m2  SpO2 97%       Assessment & Plan:  1: Chronic heart failure with reduced ejection fraction- Patient presents with shortness of breath that he describes as improving in nature. He continues to feel quite tired and is having difficulty sleeping. He is not  having any swelling in his legs or his abdomen. He continues to weigh himself and he says that his weight has been stable. By our scale, he's lost 8 pounds since he was last here on 01/22/16. Reminded to call for an overnight weight gain of >2 pounds or a weekly weight gain of >5 pounds. He also has parameters in place to take an additional furosemide if his weight goes up as well. He's not adding any salt to his food and is trying to eat foods low in sodium. Again discussed entresto with him and a brochure was given to him and he's going to check with his local pharmacy to see what the cost would be. Saw his cardiologist this morning. [facility] went in and reviewed medications with the patient.  2: HTN- Blood pressure looks good today. He sees his PCP next week. 3: Obstructive sleep apnea- He says that he's recently gotten new equipment but he's having difficulty adjusting to it. He says that the new mask wasn't  sealing well and air was leaking out so he's tried his old mask but it's still not fitting correctly. He says that his old one blew air constantly and the new one is intermittent. Have left a message for [facility] to discuss further so that this can get looked into for the patient.  4: Depression- [facility] and [facility] people are nice and the food is good. He does admit that it's not the same as living "at the [place]" but he understands that this helps his kids feel better about him living on his own. He says that he doesn't like asking people for rides so information was given to him regarding [facility].  Return here in 2 months or sooner for any questions/problems before then.

## 2016-05-24 ENCOUNTER — Ambulatory Visit: Payer: Medicare Other | Admitting: Family

## 2016-06-08 ENCOUNTER — Ambulatory Visit: Payer: Medicare Other | Admitting: Family

## 2016-07-07 ENCOUNTER — Ambulatory Visit: Payer: Medicare Other | Attending: Family | Admitting: Family

## 2016-07-07 ENCOUNTER — Encounter: Payer: Self-pay | Admitting: Family

## 2016-07-07 VITALS — BP 145/78 | HR 65 | Resp 20 | Ht 68.0 in | Wt 224.0 lb

## 2016-07-07 DIAGNOSIS — I5022 Chronic systolic (congestive) heart failure: Secondary | ICD-10-CM | POA: Insufficient documentation

## 2016-07-07 DIAGNOSIS — H538 Other visual disturbances: Secondary | ICD-10-CM | POA: Insufficient documentation

## 2016-07-07 DIAGNOSIS — Z951 Presence of aortocoronary bypass graft: Secondary | ICD-10-CM | POA: Diagnosis not present

## 2016-07-07 DIAGNOSIS — Z950 Presence of cardiac pacemaker: Secondary | ICD-10-CM | POA: Insufficient documentation

## 2016-07-07 DIAGNOSIS — R42 Dizziness and giddiness: Secondary | ICD-10-CM | POA: Diagnosis not present

## 2016-07-07 DIAGNOSIS — G20 Parkinson's disease: Secondary | ICD-10-CM | POA: Insufficient documentation

## 2016-07-07 DIAGNOSIS — G4733 Obstructive sleep apnea (adult) (pediatric): Secondary | ICD-10-CM | POA: Insufficient documentation

## 2016-07-07 DIAGNOSIS — I517 Cardiomegaly: Secondary | ICD-10-CM | POA: Diagnosis not present

## 2016-07-07 DIAGNOSIS — R5383 Other fatigue: Secondary | ICD-10-CM | POA: Insufficient documentation

## 2016-07-07 DIAGNOSIS — R600 Localized edema: Secondary | ICD-10-CM | POA: Diagnosis not present

## 2016-07-07 DIAGNOSIS — I10 Essential (primary) hypertension: Secondary | ICD-10-CM

## 2016-07-07 DIAGNOSIS — I110 Hypertensive heart disease with heart failure: Secondary | ICD-10-CM | POA: Diagnosis not present

## 2016-07-07 DIAGNOSIS — R0602 Shortness of breath: Secondary | ICD-10-CM | POA: Insufficient documentation

## 2016-07-07 DIAGNOSIS — E119 Type 2 diabetes mellitus without complications: Secondary | ICD-10-CM | POA: Diagnosis not present

## 2016-07-07 DIAGNOSIS — M7989 Other specified soft tissue disorders: Secondary | ICD-10-CM | POA: Insufficient documentation

## 2016-07-07 DIAGNOSIS — Z7982 Long term (current) use of aspirin: Secondary | ICD-10-CM | POA: Diagnosis not present

## 2016-07-07 DIAGNOSIS — R140 Abdominal distension (gaseous): Secondary | ICD-10-CM | POA: Insufficient documentation

## 2016-07-07 NOTE — Progress Notes (Signed)
Subjective:    Patient ID: Jermaine [name]., male    DOB: [birth year], 82 y.o.   MRN: [id number]  Congestive Heart Failure  Presents for follow-up visit. Associated symptoms include fatigue and shortness of breath. Pertinent negatives include no abdominal pain, chest pain, orthopnea or palpitations. The symptoms have been worsening. Compliance with total regimen is 51-75%. Compliance problems include adherence to exercise.   Hypertension  This is a chronic problem. The current episode started more than 1 year ago. The problem is unchanged. The problem is controlled. Associated symptoms include peripheral edema and shortness of breath. Pertinent negatives include no chest pain, headaches, neck pain or palpitations. There are no associated agents to hypertension. Risk factors for coronary artery disease include diabetes mellitus, male gender, obesity and sedentary lifestyle. Past treatments include beta blockers, ACE inhibitors, angiotensin blockers, diuretics and lifestyle changes. The current treatment provides significant improvement. Compliance problems include exercise.  Hypertensive end-organ damage includes heart failure.   Past Medical History:  Diagnosis Date  . CHF (congestive heart failure) (HCC)   . Diabetes mellitus without complication (HCC)   . Hypertension   . LVH (left ventricular hypertrophy)   . OSA (obstructive sleep apnea)   . Parkinson's disease (HCC) 2008    Past Surgical History:  Procedure Laterality Date  . CORONARY ARTERY BYPASS GRAFT  2008   double   . PACEMAKER PLACEMENT N/A 2016    Family History  Problem Relation Age of Onset  . Anemia Neg Hx   . Arrhythmia Neg Hx   . Asthma Neg Hx   . Clotting disorder Neg Hx   . Fainting Neg Hx   . Heart attack Neg Hx   . Heart disease Neg Hx   . Heart failure Neg Hx   . Hyperlipidemia Neg Hx   . Hypertension Neg Hx     Social History  Substance Use Topics  . Smoking status: Never Smoker  . Smokeless  tobacco: Never Used  . Alcohol use No    No Known Allergies  Prior to Admission medications   Medication Sig Start Date End Date Taking? Authorizing Provider  allopurinol (ZYLOPRIM) 300 MG tablet Take 150 mg by mouth daily.    Yes Historical Provider, MD  aspirin 325 MG tablet Take 325 mg by mouth at bedtime.   Yes Historical Provider, MD  carbidopa-levodopa (SINEMET IR) 25-250 MG tablet Take 2 tablets by mouth 2 (two) times daily.   Yes Historical Provider, MD  carvedilol (COREG) 3.125 MG tablet Take 3.125 mg by mouth 2 (two) times daily with a meal.   Yes Historical Provider, MD  cholecalciferol (VITAMIN D) 1000 UNITS tablet Take 1,000 Units by mouth daily.   Yes Historical Provider, MD  docusate sodium ([name]) 100 MG capsule Take 100 mg by mouth every other day.   Yes Historical Provider, MD  doxazosin (CARDURA) 8 MG tablet Take 4 mg by mouth daily.    Yes Historical Provider, MD  Melatonin 5 MG TABS Take 5 mg by mouth at bedtime.    Yes Historical Provider, MD  polyethylene glycol (MIRALAX / GLYCOLAX) packet Take 17 g by mouth daily as needed for mild constipation.   Yes Historical Provider, MD  pravastatin (PRAVACHOL) 20 MG tablet Take 20 mg by mouth at bedtime.    Yes Historical Provider, MD  rOPINIRole (REQUIP) 1 MG tablet Take 1 mg by mouth 2 (two) times daily.    Yes Historical Provider, MD  spironolactone (ALDACTONE) 25 MG tablet Take  25 mg by mouth daily.   Yes Historical Provider, MD  torsemide (DEMADEX) 20 MG tablet Take 20 mg by mouth daily.   Yes Historical Provider, MD  sacubitril-valsartan (ENTRESTO) 24-26 MG Take 1 tablet by mouth 2 (two) times daily.    Historical Provider, MD      Review of Systems  Constitutional: Positive for fatigue. Negative for appetite change.  HENT: Positive for congestion. Negative for rhinorrhea and sore throat.   Eyes: Positive for visual disturbance (blurry vision). Negative for pain.  Respiratory: Positive for shortness of breath.  Negative for cough and chest tightness.   Cardiovascular: Positive for leg swelling. Negative for chest pain and palpitations.  Gastrointestinal: Positive for abdominal distention. Negative for abdominal pain.  Endocrine: Negative.   Genitourinary: Negative.   Musculoskeletal: Negative for back pain and neck pain.  Skin: Negative.   Allergic/Immunologic: Negative.   Neurological: Positive for dizziness (with quick position changes) and tremors (at times in hands). Negative for light-headedness and headaches.  Hematological: Negative for adenopathy. Bruises/bleeds easily.  Psychiatric/Behavioral: Positive for dysphoric mood. Negative for sleep disturbance (sleeping in hospital bed with HOB elevated. Sleeping on 1 pillow) and suicidal ideas. The patient is not nervous/anxious.        Objective:   Physical Exam  Constitutional: He is oriented to person, place, and time. He appears well-developed and well-nourished.  HENT:  Head: Normocephalic and atraumatic.  Eyes: Conjunctivae are normal. Pupils are equal, round, and reactive to light.  Neck: Normal range of motion. Neck supple.  Cardiovascular: Normal rate and regular rhythm.   Pulmonary/Chest: Effort normal. No respiratory distress. He has no wheezes. He has no rales.  Abdominal: Soft. He exhibits no distension. There is no tenderness.  Musculoskeletal: He exhibits edema (trace pitting edema in left lower leg). He exhibits no tenderness.  Neurological: He is alert and oriented to person, place, and time.  Skin: Skin is warm and dry.  Psychiatric: He has a normal mood and affect. His behavior is normal. Thought content normal.  Nursing note and vitals reviewed.   BP (!) 145/78   Pulse 65   Resp 20   Ht 5\' 8"  (1.727 m)   Wt 224 lb (101.6 kg)   SpO2 98%   BMI 34.06 kg/m        Assessment & Plan:  1: Chronic heart failure with reduced ejection fraction- Patient presents with worsening shortness of breath and fatigue. He's  recently had his diuretic changed and had spironolactone added so he's not sure if he's been taking the medications long enough to notice a change. He was able to walk into the office with his walker and was short of breath but once he sat down for a few minutes, his breathing improved. He has a trace amount of pitting edema in his left lower leg. He continues to weigh himself and says that his weight has been stable. By our scale, his weight is essentially unchanged. Reminded him to call for an overnight weight gain of >2 pounds or a weekly weight gain of >5 pounds. He is not adding any salt to his food and tries to eat low sodium foods. He is currently waiting on his Sherryll Burger to get final approval from the insurance company before he can begin taking it as it's too expensive for him. Discussed that if it doesn't get approved, that we should resume his lisinopril. He verbalizes understanding and says that he's going to give it a little more time and call the  insurance company back.  2: HTN- Blood pressure looks good today. Continue medications at this time. 3: Obstructive sleep apnea- He says that he's returned the equipment because he says that it wasn't fitting correctly and he could never get an assistance from the company. Now he says that he's being billed even thought he's returned the equipment. Emotional support offered in listening to patient. 4: Parkinson disease- No tremors noted today but he says that if he's late taking his sinemet IR, the tremors quickly occur.  Medication list that patient brought was reviewed with him.  Return here in 6 months or sooner for any questions/problems before then.

## 2016-07-07 NOTE — Patient Instructions (Signed)
Continue weighing daily and call for an overnight weight gain of > 2 pounds or a weekly weight gain of >5 pounds.  If unable to obtain the [facility], may need to resume Lisinopril daily.

## 2016-11-12 ENCOUNTER — Telehealth: Payer: Self-pay | Admitting: Family

## 2016-11-12 ENCOUNTER — Telehealth: Payer: Self-pay

## 2016-11-12 NOTE — Telephone Encounter (Signed)
Raynelle FanningJulie called this morning. She states that Mr. Jermaine [name] is having some trouble with his symptoms. She is wondering if there is some treatment between his normal diuretic and a hospitalization that can be used. If you could call her back to discuss his condition and options. Thank you.

## 2016-11-12 NOTE — Telephone Encounter (Signed)
Spoke with daughter, Jermaine [name], who voices concern about her dad's quality of life. She says that she feels like he's been retaining more fluid and that his weight is up (she doesn't know what he weighs though). She notices that his feet are more swollen than usual. Is wearing oxygen around the clock. She feels like this has been gradually occurring over the last few months.   She says that he does not take his torsemide daily because he has mobility issues & when he takes the diuretic, he is either constantly going to the bathroom or cleaning up because he doesn't make it to the bathroom in time. She doesn't think he would want to double the dose because of this.   She is wondering what the options were to manage his fluid without having to go into the hospital. Discussed changing his diuretic although he's still going to urinate often. Also discussed making a referral to the [facility] since they can do the outpatient diuresis and she was open to that.   She also wanted to make an appointment here so one was made on 11/17/16. Did tell her that should his status decline over the weekend, that she should take him to the ER for evaluation. Daughter voiced understanding and was appreciative of the call.

## 2016-11-12 NOTE — Telephone Encounter (Signed)
LM on voicemail at the [facility] in [place] about calling patient's daughter, Raynelle FanningJulie, with an appointment.

## 2016-11-17 ENCOUNTER — Ambulatory Visit: Payer: Medicare Other | Attending: Family | Admitting: Family

## 2016-11-17 VITALS — BP 118/57 | HR 68 | Resp 18 | Ht 68.0 in | Wt 213.0 lb

## 2016-11-17 DIAGNOSIS — G20 Parkinson's disease: Secondary | ICD-10-CM | POA: Insufficient documentation

## 2016-11-17 DIAGNOSIS — I5022 Chronic systolic (congestive) heart failure: Secondary | ICD-10-CM

## 2016-11-17 DIAGNOSIS — R252 Cramp and spasm: Secondary | ICD-10-CM | POA: Insufficient documentation

## 2016-11-17 DIAGNOSIS — G4733 Obstructive sleep apnea (adult) (pediatric): Secondary | ICD-10-CM | POA: Insufficient documentation

## 2016-11-17 DIAGNOSIS — Z7982 Long term (current) use of aspirin: Secondary | ICD-10-CM | POA: Insufficient documentation

## 2016-11-17 DIAGNOSIS — E119 Type 2 diabetes mellitus without complications: Secondary | ICD-10-CM | POA: Diagnosis not present

## 2016-11-17 DIAGNOSIS — Z950 Presence of cardiac pacemaker: Secondary | ICD-10-CM | POA: Insufficient documentation

## 2016-11-17 DIAGNOSIS — Z79899 Other long term (current) drug therapy: Secondary | ICD-10-CM | POA: Insufficient documentation

## 2016-11-17 DIAGNOSIS — I110 Hypertensive heart disease with heart failure: Secondary | ICD-10-CM | POA: Insufficient documentation

## 2016-11-17 DIAGNOSIS — Z951 Presence of aortocoronary bypass graft: Secondary | ICD-10-CM | POA: Diagnosis not present

## 2016-11-17 DIAGNOSIS — I10 Essential (primary) hypertension: Secondary | ICD-10-CM

## 2016-11-17 LAB — MAGNESIUM: MAGNESIUM: 2.3 mg/dL (ref 1.7–2.4)

## 2016-11-17 LAB — BASIC METABOLIC PANEL
ANION GAP: 6 (ref 5–15)
BUN: 19 mg/dL (ref 6–20)
CALCIUM: 9.3 mg/dL (ref 8.9–10.3)
CO2: 30 mmol/L (ref 22–32)
CREATININE: 0.7 mg/dL (ref 0.61–1.24)
Chloride: 105 mmol/L (ref 101–111)
GFR calc non Af Amer: >60 mL/min (ref >60)
Glucose, Bld: 99 mg/dL (ref 65–99)
Potassium: 4 mmol/L (ref 3.5–5.1)
Sodium: 141 mmol/L (ref 135–145)

## 2016-11-17 MED ORDER — DOXAZOSIN MESYLATE 8 MG PO TABS: 4.0000 mg | ORAL_TABLET | Freq: Every day | ORAL | 6 refills | Status: DC

## 2016-11-17 NOTE — Patient Instructions (Addendum)
Continue weighing daily and call for an overnight weight gain of > 2 pounds or a weekly weight gain of >5 pounds.  Take fluid pill 4 days in a row and then skip one day and then resume 4 days in a row and skip one day.   Checking lab work today

## 2016-11-17 NOTE — Progress Notes (Signed)
Subjective:    Patient ID: Jermaine Colaceharles G Tuite Sr., male    DOB: March 01, 1929, 82 y.o.   MRN: [id number]  HPI  Mr Jermaine [name] is a [age] y/o male with a history of parkinson's, obstructive sleep apnea, HTN, DM and chronic heart failure.   Last echo was done on 12/02/15 and shows an EF of 20% with mild AR, severe MR/TR and moderate PR. No valvular stenosis. EF has declined from 35% on 08/12/14.  Last admission was on 03/11/16 with heart failure exacerbation. Was given IV diuretics and lost 9 kg. Discharged home after 3 days. Previously in the ED on 12/17/15 for heart failure exacerbation. Was treated and released.   He presents today for a follow-up visit with fatigue, shortness of breath and swelling in his lower legs. Not adding salt to his food. Had developed some leg cramps and resumed his potassium 20meq daily. Had only been taking his torsemide every 3 days because he didn't like having to be in the bathroom as much. However, he's taken it for the last 4 days and feels like his breathing is better. Lisinopril has been stopped per patient's report from cardiology.  Past Medical History:  Diagnosis Date  . CHF (congestive heart failure) (HCC)   . Diabetes mellitus without complication (HCC)   . Hypertension   . LVH (left ventricular hypertrophy)   . OSA (obstructive sleep apnea)   . Parkinson's disease (HCC) 2008   Past Surgical History:  Procedure Laterality Date  . CORONARY ARTERY BYPASS GRAFT  2008   double   . PACEMAKER PLACEMENT N/A 2016   Family History  Problem Relation Age of Onset  . Anemia Neg Hx   . Arrhythmia Neg Hx   . Asthma Neg Hx   . Clotting disorder Neg Hx   . Fainting Neg Hx   . Heart attack Neg Hx   . Heart disease Neg Hx   . Heart failure Neg Hx   . Hyperlipidemia Neg Hx   . Hypertension Neg Hx    Social History  Substance Use Topics  . Smoking status: Never Smoker  . Smokeless tobacco: Never Used  . Alcohol use No   No Known Allergies   Prior to Admission  medications   Medication Sig Start Date End Date Taking? Authorizing Provider  allopurinol (ZYLOPRIM) 300 MG tablet Take 150 mg by mouth daily.    Yes Historical Provider, MD  aspirin 325 MG tablet Take 325 mg by mouth at bedtime.   Yes Historical Provider, MD  carbidopa-levodopa (SINEMET IR) 25-250 MG tablet Take 2 tablets by mouth 2 (two) times daily.   Yes Historical Provider, MD  carvedilol (COREG) 3.125 MG tablet Take 3.125 mg by mouth 2 (two) times daily with a meal.   Yes Historical Provider, MD  cholecalciferol (VITAMIN D) 1000 UNITS tablet Take 1,000 Units by mouth daily.   Yes Historical Provider, MD  doxazosin (CARDURA) 8 MG tablet Take 0.5 tablets (4 mg total) by mouth daily. 11/17/16  Yes Delma Freezeina A Hackney, FNP  Melatonin 5 MG TABS Take 5 mg by mouth at bedtime.    Yes Historical Provider, MD  polyethylene glycol (MIRALAX / GLYCOLAX) packet Take 17 g by mouth daily as needed for mild constipation.   Yes Historical Provider, MD  potassium chloride SA (K-DUR,KLOR-CON) 20 MEQ tablet Take 20 mEq by mouth daily.   Yes Historical Provider, MD  pravastatin (PRAVACHOL) 20 MG tablet Take 20 mg by mouth at bedtime.    Yes Historical  Provider, MD  rOPINIRole (REQUIP) 1 MG tablet Take 1 mg by mouth 2 (two) times daily.    Yes Historical Provider, MD  spironolactone (ALDACTONE) 25 MG tablet Take 25 mg by mouth daily.   Yes Historical Provider, MD  torsemide (DEMADEX) 20 MG tablet Take 20 mg by mouth daily.   Yes Historical Provider, MD  docusate sodium (COLACE) 100 MG capsule Take 100 mg by mouth every other day.    Historical Provider, MD     Review of Systems  Constitutional: Positive for appetite change (feel full easily) and fatigue.  HENT: Negative for congestion, postnasal drip and sore throat.   Eyes: Negative.   Respiratory: Positive for shortness of breath. Negative for chest tightness.   Cardiovascular: Positive for leg swelling. Negative for chest pain and palpitations.   Gastrointestinal: Negative for abdominal distention and abdominal pain.  Endocrine: Negative.   Genitourinary: Negative.   Musculoskeletal: Negative for back pain and neck pain.  Skin: Negative.   Allergic/Immunologic: Negative.   Neurological: Negative for dizziness and light-headedness.       Cramps in legs in the mornings   Hematological: Negative for adenopathy. Does not bruise/bleed easily.  Psychiatric/Behavioral: Negative for dysphoric mood and sleep disturbance. The patient is not nervous/anxious.    Vitals:   11/17/16 1124  BP: (!) 118/57  Pulse: 68  Resp: 18  SpO2: 96%  Weight: 213 lb (96.6 kg)  Height: 5\' 8"  (1.727 m)   Wt Readings from Last 3 Encounters:  11/17/16 213 lb (96.6 kg)  07/07/16 224 lb (101.6 kg)  03/22/16 225 lb (102.1 kg)   Lab Results  Component Value Date   CREATININE 0.70 11/17/2016   CREATININE 0.84 03/14/2016   CREATININE 0.87 03/13/2016       Objective:   Physical Exam  Constitutional: He is oriented to person, place, and time. He appears well-developed and well-nourished.  HENT:  Head: Normocephalic and atraumatic.  Eyes: Conjunctivae are normal. Pupils are equal, round, and reactive to light.  Neck: Normal range of motion. Neck supple. No JVD present.  Cardiovascular: Normal rate and regular rhythm.   Pulmonary/Chest: Effort normal. He has no wheezes. He has no rales.  Abdominal: Soft. He exhibits no distension. There is no tenderness.  Musculoskeletal: He exhibits edema (2+ pitting edema in bilateral lower legs with L>R). He exhibits no tenderness.  Neurological: He is alert and oriented to person, place, and time.  Skin: Skin is warm and dry.  Psychiatric: He has a normal mood and affect. His behavior is normal. Thought content normal.  Nursing note and vitals reviewed.     Assessment & Plan:  1: Chronic heart failure with reduced ejection fraction- - NYHA class III - mildly fluid overloaded today - weight down 11.8 pounds  since last here July 2017 - needs to elevate legs when at home - long discussion about taking his torsemide. He recognizes that he feels better since taking it for the last 4 days but would like to enjoy some days where he doesn't have to take it. He is agreeable to taking his torsemide daily for 4 days, skipping 1 day, resume for 4 days, skip 1 day etc.  - continue to weigh daily and call for an overnight weight gain of >2 pounds/weekly weight gain of >5 pounds - not adding salt - had [facility] but if he's willing to take the torsemide as prescribed, feel like he can cancel it. He says  that he will cancel it and it can always be rescheduled at a later date if needed.  - BMP drawn today - lisinopril has been stopped per his report per cardiology  2: HTN- - BP looks good today  3: Muscle cramping- - he has resumed his potassium daily - can begin magnesium daily - will check a magnesium level today  4: Parkinson disease- - appears to be stable at this time - sees neurologist tomorrow  Return here in 1 month or sooner for any questions/problems before then.

## 2016-11-18 ENCOUNTER — Encounter: Payer: Self-pay | Admitting: Family

## 2016-11-18 ENCOUNTER — Telehealth: Payer: Self-pay | Admitting: Family

## 2016-11-18 NOTE — Telephone Encounter (Signed)
Advised patient that his renal function, sodium, potassium and magnesium levels that were checked on 11/17/16 were all normal.   Patient's daughter, Raynelle FanningJulie, also called and informed.

## 2016-11-19 ENCOUNTER — Encounter (HOSPITAL_COMMUNITY): Payer: Medicare Other

## 2016-12-27 ENCOUNTER — Ambulatory Visit: Payer: Medicare Other | Admitting: Family

## 2017-02-01 ENCOUNTER — Ambulatory Visit: Payer: Medicare Other | Admitting: Family

## 2017-02-15 ENCOUNTER — Ambulatory Visit: Payer: Medicare Other | Admitting: Family

## 2017-02-17 ENCOUNTER — Encounter: Payer: Self-pay | Admitting: Family

## 2017-02-17 ENCOUNTER — Ambulatory Visit: Payer: Medicare Other | Attending: Family | Admitting: Family

## 2017-02-17 VITALS — BP 128/62 | HR 75 | Resp 18 | Ht 68.0 in | Wt 212.5 lb

## 2017-02-17 DIAGNOSIS — E119 Type 2 diabetes mellitus without complications: Secondary | ICD-10-CM | POA: Insufficient documentation

## 2017-02-17 DIAGNOSIS — Z79899 Other long term (current) drug therapy: Secondary | ICD-10-CM | POA: Diagnosis not present

## 2017-02-17 DIAGNOSIS — I5022 Chronic systolic (congestive) heart failure: Secondary | ICD-10-CM | POA: Insufficient documentation

## 2017-02-17 DIAGNOSIS — G4733 Obstructive sleep apnea (adult) (pediatric): Secondary | ICD-10-CM | POA: Diagnosis not present

## 2017-02-17 DIAGNOSIS — Z951 Presence of aortocoronary bypass graft: Secondary | ICD-10-CM | POA: Diagnosis not present

## 2017-02-17 DIAGNOSIS — Z7982 Long term (current) use of aspirin: Secondary | ICD-10-CM | POA: Diagnosis not present

## 2017-02-17 DIAGNOSIS — I10 Essential (primary) hypertension: Secondary | ICD-10-CM

## 2017-02-17 DIAGNOSIS — I110 Hypertensive heart disease with heart failure: Secondary | ICD-10-CM | POA: Diagnosis not present

## 2017-02-17 DIAGNOSIS — G20 Parkinson's disease: Secondary | ICD-10-CM | POA: Insufficient documentation

## 2017-02-17 DIAGNOSIS — Z950 Presence of cardiac pacemaker: Secondary | ICD-10-CM | POA: Diagnosis not present

## 2017-02-17 DIAGNOSIS — Z9981 Dependence on supplemental oxygen: Secondary | ICD-10-CM | POA: Diagnosis not present

## 2017-02-17 NOTE — Patient Instructions (Addendum)
Continue weighing daily and call for an overnight weight gain of > 2 pounds or a weekly weight gain of >5 pounds.  Someone from [facility] (home health) will be calling you.   Ask the pharmacy about entresto cost but let us know if the cost is not affordable for you.

## 2017-02-17 NOTE — Progress Notes (Signed)
Patient ID: Jermaine Colace., male    DOB: 1929-02-22, 83 y.o.   MRN: [id number]  HPI  Jermaine [name] is a 83 y/o male with a history of parkinson's, obstructive sleep apnea, HTN, DM and chronic heart failure.   Last echo was done on 12/02/15 and shows an EF of 20% with mild AR, severe Jermaine/TR and moderate PR. No valvular stenosis. EF has declined from 35% on 08/12/14.  Last admission was on 03/11/16 with heart failure exacerbation. Was given IV diuretics and lost 9 kg. Discharged home after 3 days. Previously in the ED on 12/17/15 for heart failure exacerbation. Was treated and released.   He presents today for a follow-up visit with fatigue, shortness of breath and swelling in his lower legs. Not adding salt to his food. Continues to take his torsemide about every 3 days. Lisinopril has been stopped per patient's report from cardiology. Does wear oxygen at 2L at home and at bedtime. Feels like his parkinson's is getting worse.   Past Medical History:  Diagnosis Date  . CHF (congestive heart failure) (HCC)   . Diabetes mellitus without complication (HCC)   . Hypertension   . LVH (left ventricular hypertrophy)   . OSA (obstructive sleep apnea)   . Parkinson's disease (HCC) 2008   Past Surgical History:  Procedure Laterality Date  . CORONARY ARTERY BYPASS GRAFT  2008   double   . PACEMAKER PLACEMENT N/A 2016   Family History  Problem Relation Age of Onset  . Anemia Neg Hx   . Arrhythmia Neg Hx   . Asthma Neg Hx   . Clotting disorder Neg Hx   . Fainting Neg Hx   . Heart attack Neg Hx   . Heart disease Neg Hx   . Heart failure Neg Hx   . Hyperlipidemia Neg Hx   . Hypertension Neg Hx    Social History  Substance Use Topics  . Smoking status: Never Smoker  . Smokeless tobacco: Never Used  . Alcohol use No   No Known Allergies  Prior to Admission medications   Medication Sig Start Date End Date Taking? Authorizing Provider  allopurinol (ZYLOPRIM) 300 MG tablet Take 150 mg by mouth  daily.    Yes Historical Provider, MD  aspirin 325 MG tablet Take 325 mg by mouth at bedtime.   Yes Historical Provider, MD  carbidopa-levodopa (SINEMET IR) 25-250 MG tablet Take 2 tablets by mouth 2 (two) times daily.   Yes Historical Provider, MD  carvedilol (COREG) 3.125 MG tablet Take 3.125 mg by mouth 2 (two) times daily with a meal.   Yes Historical Provider, MD  cholecalciferol (VITAMIN D) 1000 UNITS tablet Take 1,000 Units by mouth daily.   Yes Historical Provider, MD  docusate sodium (COLACE) 100 MG capsule Take 100 mg by mouth every other day.   Yes Historical Provider, MD  doxazosin (CARDURA) 8 MG tablet Take 0.5 tablets (4 mg total) by mouth daily. 11/17/16  Yes Delma Freeze, FNP  Melatonin 5 MG TABS Take 5 mg by mouth at bedtime.    Yes Historical Provider, MD  polyethylene glycol (MIRALAX / GLYCOLAX) packet Take 17 g by mouth daily as needed for mild constipation.   Yes Historical Provider, MD  potassium chloride SA (K-DUR,KLOR-CON) 20 MEQ tablet Take 20 mEq by mouth daily.   Yes Historical Provider, MD  pravastatin (PRAVACHOL) 20 MG tablet Take 20 mg by mouth at bedtime.    Yes Historical Provider, MD  rOPINIRole (REQUIP)  1 MG tablet Take 1 mg by mouth 2 (two) times daily.    Yes Historical Provider, MD  spironolactone (ALDACTONE) 25 MG tablet Take 25 mg by mouth daily.   Yes Historical Provider, MD  torsemide (DEMADEX) 20 MG tablet Take 20 mg by mouth daily.   Yes Historical Provider, MD   Review of Systems  Constitutional: Positive for fatigue. Negative for appetite change.  HENT: Negative for congestion, postnasal drip and sore throat.   Eyes: Negative.   Respiratory: Positive for cough and shortness of breath. Negative for chest tightness.   Cardiovascular: Positive for leg swelling. Negative for chest pain and palpitations.  Gastrointestinal: Negative for abdominal distention and abdominal pain.  Endocrine: Negative.   Genitourinary: Negative.   Musculoskeletal: Negative  for back pain and neck pain.  Skin: Negative.   Allergic/Immunologic: Negative.   Neurological: Positive for tremors and weakness. Negative for dizziness and light-headedness.  Hematological: Negative for adenopathy. Does not bruise/bleed easily.  Psychiatric/Behavioral: Negative for dysphoric mood, sleep disturbance (sleeping well with melatonin) and suicidal ideas. The patient is not nervous/anxious.    Vitals:   02/17/17 1406  BP: 128/62  Pulse: 75  Resp: 18  SpO2: 94%  Weight: 212 lb 8 oz (96.4 kg)  Height: 5\' 8"  (1.727 m)   Wt Readings from Last 3 Encounters:  02/17/17 212 lb 8 oz (96.4 kg)  11/17/16 213 lb (96.6 kg)  07/07/16 224 lb (101.6 kg)   Lab Results  Component Value Date   CREATININE 0.70 11/17/2016   CREATININE 0.84 03/14/2016   CREATININE 0.87 03/13/2016    Physical Exam  Constitutional: He is oriented to person, place, and time. He appears well-developed and well-nourished.  HENT:  Head: Normocephalic and atraumatic.  Eyes: Conjunctivae are normal. Pupils are equal, round, and reactive to light.  Neck: Normal range of motion. Neck supple.  Cardiovascular: Normal rate and regular rhythm.   Pulmonary/Chest: Effort normal. He has no wheezes. He has no rales.  Abdominal: Soft. He exhibits no distension. There is no tenderness.  Musculoskeletal: He exhibits edema (1+ pitting edema in bilateral lower legs). He exhibits no tenderness.  Neurological: He is alert and oriented to person, place, and time. He displays tremor (hands).  Skin: Skin is warm and dry.  Psychiatric: He has a normal mood and affect. His behavior is normal. Thought content normal.  Nursing note and vitals reviewed.   Assessment & Plan:  1: Chronic heart failure with reduced ejection fraction- - NYHA class III - mildly fluid overloaded today - weight stable. Reminded to call for an overnight weight gain of >2 pounds or a weekly weight gain of >5 pounds - needs to elevate legs when at  home - he is taking his torsemide every 3 days and knows that if he took it more often, it would help with the edema but he wants to have some days where he's not running to the bathroom every 15 minutes - not adding salt - discussed, again, about adding entresto and explained that their foundation has funds to assist with paying for it. Information given and he says that he'll think about it - due to see cardiologist Gwen Pounds) 04/28/17  2: HTN- - BP looks good today - due to see PCP Dareen Piano) 03/04/17  3: Obstructive sleep apnea- - wearing oxygen at 2L when at home and at bedtime  4: Parkinson disease- - patient feels like this is getting worse and that he may have to move into assisted living -  saw neurologist Sherryll Burger([facility]) 11/18/17 and returns 05/20/17 - discussed [facility] and patient is interested in that service, especially if it'll keep him independent as long as possible. [facility]  Medication list was reviewed.  Return here in 2 months or sooner for any questions/problems before then.

## 2017-03-03 ENCOUNTER — Telehealth: Payer: Self-pay | Admitting: Family

## 2017-03-03 NOTE — Telephone Encounter (Signed)
Jermaine [name] ([facility] with [facility] called to ask that he be given an order to see Jermaine [name] once a week for 5 weeks for strength training related to his HF. He also wanted to know if he could check his oximetry during these visits.  Order given for the weekly visits for 5 weeks as well as for oximetry checking.

## 2017-03-25 ENCOUNTER — Telehealth: Payer: Self-pay | Admitting: Cardiovascular Disease

## 2017-03-25 NOTE — Telephone Encounter (Signed)
[facility] home [facility] calling to let Jermaine [name] know that they have completed and are signing off with home health .  Patient is not always compliant with taking diuretic but he is recording his weight consistently .     Please call if you have any questions.

## 2017-05-06 ENCOUNTER — Ambulatory Visit: Payer: Medicare Other | Attending: Family | Admitting: Family

## 2017-05-06 ENCOUNTER — Encounter: Payer: Self-pay | Admitting: Family

## 2017-05-06 VITALS — BP 107/58 | HR 67 | Resp 20 | Ht 68.0 in | Wt 211.2 lb

## 2017-05-06 DIAGNOSIS — R0602 Shortness of breath: Secondary | ICD-10-CM | POA: Insufficient documentation

## 2017-05-06 DIAGNOSIS — Z7982 Long term (current) use of aspirin: Secondary | ICD-10-CM | POA: Insufficient documentation

## 2017-05-06 DIAGNOSIS — I110 Hypertensive heart disease with heart failure: Secondary | ICD-10-CM | POA: Insufficient documentation

## 2017-05-06 DIAGNOSIS — G4733 Obstructive sleep apnea (adult) (pediatric): Secondary | ICD-10-CM | POA: Diagnosis not present

## 2017-05-06 DIAGNOSIS — E119 Type 2 diabetes mellitus without complications: Secondary | ICD-10-CM | POA: Diagnosis not present

## 2017-05-06 DIAGNOSIS — I5022 Chronic systolic (congestive) heart failure: Secondary | ICD-10-CM | POA: Diagnosis present

## 2017-05-06 DIAGNOSIS — I10 Essential (primary) hypertension: Secondary | ICD-10-CM

## 2017-05-06 DIAGNOSIS — R531 Weakness: Secondary | ICD-10-CM | POA: Insufficient documentation

## 2017-05-06 DIAGNOSIS — G20 Parkinson's disease: Secondary | ICD-10-CM | POA: Insufficient documentation

## 2017-05-06 NOTE — Progress Notes (Signed)
Patient ID: Jermaine Colaceharles G Lofton Sr., male    DOB: 1929/02/26, 83 y.o.   MRN: [id number]  HPI  Mr Jermaine [name] is a 83 y/o male with a history of parkinson's, obstructive sleep apnea, HTN, DM and chronic heart failure.   Reviewed most recent echo done on 02/10/17 which showed an EF of 20% along with severe MR/TR. Previous echo was done on 12/02/15 and shows an EF of 20% with mild AR, severe MR/TR and moderate PR. No valvular stenosis. EF has declined from 35% on 08/12/14.  Last admission was on 03/11/16 with heart failure exacerbation. Was given IV diuretics and lost 9 kg. Discharged home after 3 days. Previously in the ED on 12/17/15 for heart failure exacerbation. Was treated and released.   He presents today for a follow-up visit with a chief complaint of chronic weakness in his legs and arms. He does feel like the weakness is worsening. He describes this as chronic in nature. He has associated fatigue, shortness of breath, tremors and pedal edema along with this. Does walk with his walker at all times and denies any recent falls.   Past Medical History:  Diagnosis Date  . CHF (congestive heart failure) (HCC)   . Diabetes mellitus without complication (HCC)   . Hypertension   . LVH (left ventricular hypertrophy)   . OSA (obstructive sleep apnea)   . Parkinson's disease (HCC) 2008   Past Surgical History:  Procedure Laterality Date  . CORONARY ARTERY BYPASS GRAFT  2008   double   . PACEMAKER PLACEMENT N/A 2016   Family History  Problem Relation Age of Onset  . Anemia Neg Hx   . Arrhythmia Neg Hx   . Asthma Neg Hx   . Clotting disorder Neg Hx   . Fainting Neg Hx   . Heart attack Neg Hx   . Heart disease Neg Hx   . Heart failure Neg Hx   . Hyperlipidemia Neg Hx   . Hypertension Neg Hx    Social History  Substance Use Topics  . Smoking status: Never Smoker  . Smokeless tobacco: Never Used  . Alcohol use No   No Known Allergies  Prior to Admission medications   Medication Sig Start Date  End Date Taking? Authorizing Provider  allopurinol (ZYLOPRIM) 300 MG tablet Take 150 mg by mouth daily.    Yes [provider]  aspirin 325 MG tablet Take 325 mg by mouth at bedtime.   Yes [provider]  carbidopa-levodopa (SINEMET IR) 25-250 MG tablet Take 2 tablets by mouth 2 (two) times daily.   Yes [provider]  carvedilol (COREG) 3.125 MG tablet Take 3.125 mg by mouth 2 (two) times daily with a meal.   Yes [provider]  cholecalciferol (VITAMIN D) 1000 UNITS tablet Take 1,000 Units by mouth daily.   Yes [provider]  Melatonin 5 MG TABS Take 5 mg by mouth at bedtime.    Yes [provider]  polyethylene glycol (MIRALAX / GLYCOLAX) packet Take 17 g by mouth daily as needed for mild constipation.   Yes [provider]  potassium chloride SA (K-DUR,KLOR-CON) 20 MEQ tablet Take 20 mEq by mouth daily.   Yes [provider]  pravastatin (PRAVACHOL) 20 MG tablet Take 20 mg by mouth at bedtime.    Yes [provider]  rOPINIRole (REQUIP) 1 MG tablet Take 1 mg by mouth 2 (two) times daily.    Yes [provider]  tamsulosin (FLOMAX) 0.4 MG  CAPS capsule Take 0.4 mg by mouth daily.   Yes [provider]  torsemide (DEMADEX) 20 MG tablet Take 20 mg by mouth daily.   Yes [provider]    Review of Systems  Constitutional: Positive for fatigue. Negative for appetite change.  HENT: Negative for congestion, postnasal drip and sore throat.   Eyes: Negative.   Respiratory: Positive for cough and shortness of breath. Negative for chest tightness.   Cardiovascular: Positive for leg swelling. Negative for chest pain and palpitations.  Gastrointestinal: Negative for abdominal distention and abdominal pain.  Endocrine: Negative.   Genitourinary: Negative.   Musculoskeletal: Positive for arthralgias (right shoulder aches/limited ROM). Negative for back pain and neck pain.  Skin: Negative.    Allergic/Immunologic: Negative.   Neurological: Positive for tremors and weakness. Negative for dizziness and light-headedness.  Hematological: Negative for adenopathy. Does not bruise/bleed easily.  Psychiatric/Behavioral: Negative for dysphoric mood, sleep disturbance (sleeping well with melatonin) and suicidal ideas. The patient is not nervous/anxious.    Vitals:   05/06/17 1357  BP: (!) 107/58  Pulse: 67  Resp: 20  SpO2: 94%  Weight: 211 lb 4 oz (95.8 kg)  Height: 5\' 8"  (1.727 m)   Wt Readings from Last 3 Encounters:  05/06/17 211 lb 4 oz (95.8 kg)  02/17/17 212 lb 8 oz (96.4 kg)  11/17/16 213 lb (96.6 kg)    Lab Results  Component Value Date   CREATININE 0.70 11/17/2016   CREATININE 0.84 03/14/2016   CREATININE 0.87 03/13/2016    Physical Exam  Constitutional: He is oriented to person, place, and time. He appears well-developed and well-nourished.  HENT:  Head: Normocephalic and atraumatic.  Neck: Normal range of motion. Neck supple.  Cardiovascular: Normal rate and regular rhythm.   Pulmonary/Chest: Effort normal. He has no wheezes. He has no rales.  Abdominal: Soft. He exhibits no distension. There is no tenderness.  Musculoskeletal: He exhibits edema (2+ pitting edema in bilateral lower legs). He exhibits no tenderness.  Neurological: He is alert and oriented to person, place, and time. He displays tremor (hands and head).  Skin: Skin is warm and dry.  Psychiatric: He has a normal mood and affect. His behavior is normal. Thought content normal.  Nursing note and vitals reviewed.   Assessment & Plan:  1: Chronic heart failure with reduced ejection fraction- - NYHA class III - mildly fluid overloaded today - weight stable. Reminded to call for an overnight weight gain of >2 pounds or a weekly weight gain of >5 pounds - needs to elevate legs when at home - he is taking his torsemide every day now and says that his shortness of breath is better since taking it  daily - not adding salt - discussed, again, about adding entresto and explained that their foundation has funds to assist with paying for it. Information given and he says that he'll think about it - saw cardiologist Gwen Pounds) 04/28/17  2: HTN- - BP looks good today - saw PCP Dareen Piano) 03/23/17  3: Obstructive sleep apnea- - wearing oxygen at 2L when at home and at bedtime - unable to tolerate CPAP  4: Parkinson disease- - patient feels like this is getting worse and that he may have to move into assisted living - saw neurologist Sherryll Burger) 11/18/17 and returns 06/14/17 [facility] finished with him a few weeks ago (he thinks). He's concerned that he's becoming weaker since they stopped coming and he's not been doing his exercises like he did  when they were there. Having difficulty getting his wheelchair into/out of his car so that he can get to the dining hall to eat his meal. - will see if [facility] is able to resume services for him.   Medication list was reviewed.  Return here in 4 months or sooner for any questions/problems before then.

## 2017-05-06 NOTE — Patient Instructions (Signed)
Continue weighing daily and call for an overnight weight gain of > 2 pounds or a weekly weight gain of >5 pounds. 

## 2017-06-13 ENCOUNTER — Telehealth: Payer: Self-pay | Admitting: Cardiovascular Disease

## 2017-06-13 NOTE — Telephone Encounter (Signed)
CHF clinic patient.

## 2017-06-13 NOTE — Telephone Encounter (Signed)
[facility] to continue physical therapy once a week for the next 2 weeks.

## 2017-06-13 NOTE — Telephone Encounter (Signed)
PT needs verbal order to continue Physical Therapy once a week for 2 weeks.

## 2017-07-07 DIAGNOSIS — J9611 Chronic respiratory failure with hypoxia: Secondary | ICD-10-CM | POA: Insufficient documentation

## 2017-09-07 ENCOUNTER — Ambulatory Visit: Payer: Medicare Other | Admitting: Family

## 2017-11-19 DIAGNOSIS — K5909 Other constipation: Secondary | ICD-10-CM | POA: Insufficient documentation

## 2017-11-19 DIAGNOSIS — Z7409 Other reduced mobility: Secondary | ICD-10-CM | POA: Insufficient documentation

## 2017-12-30 ENCOUNTER — Other Ambulatory Visit: Payer: Self-pay

## 2017-12-30 ENCOUNTER — Inpatient Hospital Stay
Admission: EM | Admit: 2017-12-30 | Discharge: 2018-01-03 | DRG: 392 | Disposition: A | Discharge status: Skilled Nursing Facility | Payer: Medicare Other | Attending: Internal Medicine | Admitting: Internal Medicine

## 2017-12-30 ENCOUNTER — Encounter: Payer: Self-pay | Admitting: Emergency Medicine

## 2017-12-30 DIAGNOSIS — G2581 Restless legs syndrome: Secondary | ICD-10-CM | POA: Diagnosis present

## 2017-12-30 DIAGNOSIS — K259 Gastric ulcer, unspecified as acute or chronic, without hemorrhage or perforation: Secondary | ICD-10-CM | POA: Diagnosis present

## 2017-12-30 DIAGNOSIS — Z950 Presence of cardiac pacemaker: Secondary | ICD-10-CM | POA: Diagnosis not present

## 2017-12-30 DIAGNOSIS — Z66 Do not resuscitate: Secondary | ICD-10-CM | POA: Diagnosis present

## 2017-12-30 DIAGNOSIS — Z7982 Long term (current) use of aspirin: Secondary | ICD-10-CM | POA: Diagnosis not present

## 2017-12-30 DIAGNOSIS — I110 Hypertensive heart disease with heart failure: Secondary | ICD-10-CM | POA: Diagnosis present

## 2017-12-30 DIAGNOSIS — K269 Duodenal ulcer, unspecified as acute or chronic, without hemorrhage or perforation: Secondary | ICD-10-CM | POA: Diagnosis present

## 2017-12-30 DIAGNOSIS — K5730 Diverticulosis of large intestine without perforation or abscess without bleeding: Secondary | ICD-10-CM | POA: Diagnosis present

## 2017-12-30 DIAGNOSIS — G4733 Obstructive sleep apnea (adult) (pediatric): Secondary | ICD-10-CM | POA: Diagnosis present

## 2017-12-30 DIAGNOSIS — E876 Hypokalemia: Secondary | ICD-10-CM | POA: Diagnosis present

## 2017-12-30 DIAGNOSIS — G20 Parkinson's disease: Secondary | ICD-10-CM | POA: Diagnosis present

## 2017-12-30 DIAGNOSIS — I5022 Chronic systolic (congestive) heart failure: Secondary | ICD-10-CM | POA: Diagnosis present

## 2017-12-30 DIAGNOSIS — D62 Acute posthemorrhagic anemia: Secondary | ICD-10-CM | POA: Diagnosis present

## 2017-12-30 DIAGNOSIS — Z79899 Other long term (current) drug therapy: Secondary | ICD-10-CM

## 2017-12-30 DIAGNOSIS — E119 Type 2 diabetes mellitus without complications: Secondary | ICD-10-CM | POA: Diagnosis present

## 2017-12-30 DIAGNOSIS — K2970 Gastritis, unspecified, without bleeding: Secondary | ICD-10-CM | POA: Diagnosis present

## 2017-12-30 DIAGNOSIS — E785 Hyperlipidemia, unspecified: Secondary | ICD-10-CM | POA: Diagnosis present

## 2017-12-30 DIAGNOSIS — K922 Gastrointestinal hemorrhage, unspecified: Secondary | ICD-10-CM | POA: Diagnosis not present

## 2017-12-30 DIAGNOSIS — E875 Hyperkalemia: Secondary | ICD-10-CM | POA: Diagnosis present

## 2017-12-30 DIAGNOSIS — Z951 Presence of aortocoronary bypass graft: Secondary | ICD-10-CM

## 2017-12-30 DIAGNOSIS — J9611 Chronic respiratory failure with hypoxia: Secondary | ICD-10-CM | POA: Diagnosis present

## 2017-12-30 DIAGNOSIS — J449 Chronic obstructive pulmonary disease, unspecified: Secondary | ICD-10-CM | POA: Diagnosis present

## 2017-12-30 DIAGNOSIS — K921 Melena: Secondary | ICD-10-CM | POA: Diagnosis not present

## 2017-12-30 HISTORY — DX: Chronic obstructive pulmonary disease, unspecified: J44.9

## 2017-12-30 LAB — HEMOGLOBIN A1C
HEMOGLOBIN A1C: 5.1 % (ref 4.8–5.6)
Mean Plasma Glucose: 99.67 mg/dL

## 2017-12-30 LAB — GLUCOSE, CAPILLARY
GLUCOSE-CAPILLARY: 100 mg/dL — AB (ref 65–99)
Glucose-Capillary: 109 mg/dL — ABNORMAL HIGH (ref 65–99)

## 2017-12-30 LAB — COMPREHENSIVE METABOLIC PANEL
AST: 19 U/L (ref 15–41)
Albumin: 3.6 g/dL (ref 3.5–5.0)
Alkaline Phosphatase: 64 U/L (ref 38–126)
Anion gap: 8 (ref 5–15)
BUN: 24 mg/dL — ABNORMAL HIGH (ref 6–20)
CHLORIDE: 106 mmol/L (ref 101–111)
CO2: 29 mmol/L (ref 22–32)
CREATININE: 0.69 mg/dL (ref 0.61–1.24)
Calcium: 8.8 mg/dL — ABNORMAL LOW (ref 8.9–10.3)
Glucose, Bld: 104 mg/dL — ABNORMAL HIGH (ref 65–99)
Potassium: 4 mmol/L (ref 3.5–5.1)
Sodium: 143 mmol/L (ref 135–145)
Total Bilirubin: 1.3 mg/dL — ABNORMAL HIGH (ref 0.3–1.2)
Total Protein: 6.6 g/dL (ref 6.5–8.1)

## 2017-12-30 LAB — CBC
HCT: 36 % — ABNORMAL LOW (ref 40.0–52.0)
Hemoglobin: 11.7 g/dL — ABNORMAL LOW (ref 13.0–18.0)
MCH: 28.9 pg (ref 26.0–34.0)
MCHC: 32.5 g/dL (ref 32.0–36.0)
MCV: 88.7 fL (ref 80.0–100.0)
PLATELETS: 144 10*3/uL — AB (ref 150–440)
RBC: 4.06 MIL/uL — AB (ref 4.40–5.90)
RDW: 16.1 % — ABNORMAL HIGH (ref 11.5–14.5)
WBC: 6.5 10*3/uL (ref 3.8–10.6)

## 2017-12-30 LAB — TYPE AND SCREEN
ABO/RH(D): O POS
Antibody Screen: NEGATIVE

## 2017-12-30 MED ORDER — INSULIN ASPART 100 UNIT/ML ~~LOC~~ SOLN: 0.0000 [IU] | Freq: Three times a day (TID) | SUBCUTANEOUS | Status: DC

## 2017-12-30 MED ORDER — TORSEMIDE 20 MG PO TABS
20.0000 mg | ORAL_TABLET | Freq: Every day | ORAL | Status: DC
  Administered 2017-12-30 – 2018-01-03 (×4): 20 mg via ORAL
  Filled 2017-12-30 (×5): qty 1

## 2017-12-30 MED ORDER — ONDANSETRON HCL 4 MG/2ML IJ SOLN: 4.0000 mg | Freq: Four times a day (QID) | INTRAMUSCULAR | Status: DC | PRN

## 2017-12-30 MED ORDER — ALBUTEROL SULFATE (2.5 MG/3ML) 0.083% IN NEBU
2.5000 mg | INHALATION_SOLUTION | RESPIRATORY_TRACT | Status: DC | PRN
  Administered 2018-01-01: 2.5 mg via RESPIRATORY_TRACT
  Filled 2017-12-30: qty 3

## 2017-12-30 MED ORDER — ROPINIROLE HCL 1 MG PO TABS
1.0000 mg | ORAL_TABLET | Freq: Two times a day (BID) | ORAL | Status: DC
  Administered 2017-12-30 – 2018-01-03 (×7): 1 mg via ORAL
  Filled 2017-12-30 (×7): qty 1

## 2017-12-30 MED ORDER — SODIUM CHLORIDE 0.9% FLUSH
3.0000 mL | Freq: Two times a day (BID) | INTRAVENOUS | Status: DC
  Administered 2017-12-30 – 2018-01-03 (×6): 3 mL via INTRAVENOUS

## 2017-12-30 MED ORDER — CARVEDILOL 3.125 MG PO TABS
3.1250 mg | ORAL_TABLET | Freq: Two times a day (BID) | ORAL | Status: DC
  Administered 2017-12-30 – 2018-01-02 (×5): 3.125 mg via ORAL
  Filled 2017-12-30 (×5): qty 1

## 2017-12-30 MED ORDER — SODIUM CHLORIDE 0.9 % IV SOLN
250.0000 mL | INTRAVENOUS | Status: DC | PRN
  Administered 2018-01-02: 11:00:00 via INTRAVENOUS

## 2017-12-30 MED ORDER — CARBIDOPA-LEVODOPA 25-250 MG PO TABS
2.0000 | ORAL_TABLET | Freq: Two times a day (BID) | ORAL | Status: DC
  Administered 2017-12-30: 2 via ORAL
  Administered 2017-12-30: 2.5 via ORAL
  Administered 2017-12-31: 2 via ORAL
  Administered 2017-12-31: 2.5 via ORAL
  Administered 2018-01-01 – 2018-01-03 (×4): 2 via ORAL
  Filled 2017-12-30 (×2): qty 3
  Filled 2017-12-30: qty 1
  Filled 2017-12-30 (×8): qty 3

## 2017-12-30 MED ORDER — PRAVASTATIN SODIUM 20 MG PO TABS
20.0000 mg | ORAL_TABLET | Freq: Every day | ORAL | Status: DC
  Administered 2017-12-30 – 2018-01-02 (×4): 20 mg via ORAL
  Filled 2017-12-30 (×4): qty 1

## 2017-12-30 MED ORDER — ACETAMINOPHEN 325 MG PO TABS: 650.0000 mg | ORAL_TABLET | Freq: Four times a day (QID) | ORAL | Status: DC | PRN

## 2017-12-30 MED ORDER — ACETAMINOPHEN 650 MG RE SUPP: 650.0000 mg | Freq: Four times a day (QID) | RECTAL | Status: DC | PRN

## 2017-12-30 MED ORDER — ALLOPURINOL 150 MG HALF TABLET
150.0000 mg | ORAL_TABLET | Freq: Every day | ORAL | Status: DC
  Administered 2017-12-30 – 2018-01-03 (×4): 150 mg via ORAL
  Filled 2017-12-30 (×5): qty 1

## 2017-12-30 MED ORDER — POTASSIUM CHLORIDE CRYS ER 20 MEQ PO TBCR
20.0000 meq | EXTENDED_RELEASE_TABLET | Freq: Every day | ORAL | Status: DC
  Administered 2017-12-30 – 2018-01-03 (×4): 20 meq via ORAL
  Filled 2017-12-30 (×5): qty 1

## 2017-12-30 MED ORDER — POLYETHYLENE GLYCOL 3350 17 G PO PACK: 17.0000 g | PACK | Freq: Every day | ORAL | Status: DC | PRN

## 2017-12-30 MED ORDER — AMANTADINE HCL 100 MG PO CAPS
100.0000 mg | ORAL_CAPSULE | Freq: Two times a day (BID) | ORAL | Status: DC
  Administered 2017-12-30 – 2018-01-03 (×8): 100 mg via ORAL
  Filled 2017-12-30 (×11): qty 1

## 2017-12-30 MED ORDER — ONDANSETRON HCL 4 MG PO TABS: 4.0000 mg | ORAL_TABLET | Freq: Four times a day (QID) | ORAL | Status: DC | PRN

## 2017-12-30 MED ORDER — SODIUM CHLORIDE 0.9% FLUSH: 3.0000 mL | INTRAVENOUS | Status: DC | PRN

## 2017-12-30 NOTE — ED Provider Notes (Signed)
[facility] Emergency Department Provider Note  ____________________________________________  Time seen: Approximately 11:52 AM  I have reviewed the triage vital signs and the nursing notes.   HISTORY  Chief Complaint Rectal Bleeding    HPI Jermaine HANKEN Sr. is a 84 y.o. male who complains of bloody bowel movement last night, in his bed while asleep, and this morning again. There had anything like this before, doesn't take any anticoagulants. No abdominal pain nausea or vomiting. Describes it as a large amount of red blood in addition to the stool. He does note a long-term issues of constipation and straining and the bleeding started when he had a large bowel movement yesterday evening. Symptoms have been intermittent, moderate intensity, no aggravating or alleviating factors.     Past Medical History:  Diagnosis Date  . CHF (congestive heart failure) (HCC)   . COPD (chronic obstructive pulmonary disease) (HCC)   . Diabetes mellitus without complication (HCC)   . Hypertension   . LVH (left ventricular hypertrophy)   . OSA (obstructive sleep apnea)   . Parkinson's disease [facility]) 2008     Patient Active Problem List   Diagnosis Date Noted  . Muscle cramping 11/17/2016  . Chronic systolic heart failure (HCC) 12/25/2015  . Essential hypertension 12/25/2015  . Obstructive sleep apnea 12/25/2015  . Depression 12/25/2015  . Parkinson disease (HCC) 12/25/2015     Past Surgical History:  Procedure Laterality Date  . CORONARY ARTERY BYPASS GRAFT  2008   double   . PACEMAKER PLACEMENT N/A 2016     Prior to Admission medications   Medication Sig Start Date End Date Taking? Authorizing Provider  allopurinol (ZYLOPRIM) 300 MG tablet Take 150 mg by mouth daily.     [provider]  aspirin 325 MG tablet Take 325 mg by mouth at bedtime.    [provider]  carbidopa-levodopa (SINEMET IR) 25-250 MG tablet Take 2 tablets by mouth 2  (two) times daily.    [provider]  carvedilol (COREG) 3.125 MG tablet Take 3.125 mg by mouth 2 (two) times daily with a meal.    [provider]  cholecalciferol (VITAMIN D) 1000 UNITS tablet Take 1,000 Units by mouth daily.    [provider]  Melatonin 5 MG TABS Take 5 mg by mouth at bedtime.     [provider]  polyethylene glycol (MIRALAX / GLYCOLAX) packet Take 17 g by mouth daily as needed for mild constipation.    [provider]  potassium chloride SA (K-DUR,KLOR-CON) 20 MEQ tablet Take 20 mEq by mouth daily.    [provider]  pravastatin (PRAVACHOL) 20 MG tablet Take 20 mg by mouth at bedtime.     [provider]  rOPINIRole (REQUIP) 1 MG tablet Take 1 mg by mouth 2 (two) times daily.     [provider]  tamsulosin (FLOMAX) 0.4 MG CAPS capsule Take 0.4 mg by mouth daily.    [provider]  torsemide (DEMADEX) 20 MG tablet Take 20 mg by mouth daily.    [provider]     Allergies Patient has no known allergies.   Family History  Problem Relation Age of Onset  . Anemia Neg Hx   . Arrhythmia Neg Hx   . Asthma Neg Hx   . Clotting disorder Neg Hx   . Fainting Neg Hx   . Heart attack Neg Hx   . Heart disease Neg Hx   . Heart failure Neg Hx   .  Hyperlipidemia Neg Hx   . Hypertension Neg Hx     Social History Social History   Tobacco Use  . Smoking status: Never Smoker  . Smokeless tobacco: Never Used  Substance Use Topics  . Alcohol use: No    Alcohol/week: 0.0 oz  . Drug use: Not on file    Review of Systems  Constitutional:   No fever or chills.   Cardiovascular:   No chest pain or syncope. Respiratory:   No dyspnea or cough. Gastrointestinal:   Negative for abdominal pain, vomiting and diarrhea.   All other systems reviewed and are negative except as documented above in ROS and HPI.  ____________________________________________   PHYSICAL EXAM:  VITAL  SIGNS: ED Triage Vitals  Enc Vitals Group     BP 12/30/17 1039 (!) 112/93     Pulse Rate 12/30/17 1039 64     Resp 12/30/17 1039 18     Temp 12/30/17 1039 97.7 F (36.5 C)     Temp Source 12/30/17 1039 Oral     SpO2 12/30/17 1039 97 %     Weight 12/30/17 1040 203 lb (92.1 kg)     Height 12/30/17 1040 5\' 8"  (1.727 m)     Head Circumference --      Peak Flow --      Pain Score --      Pain Loc --      Pain Edu? --      Excl. in GC? --     Vital signs reviewed, nursing assessments reviewed.   Constitutional:   Alert and oriented. Well appearing and in no distress. Eyes:   No scleral icterus.  EOMI. No nystagmus. No conjunctival pallor. PERRL. ENT   Head:   Normocephalic and atraumatic.   Nose:   No congestion/rhinnorhea.    Mouth/Throat:   MMM, no pharyngeal erythema. No peritonsillar mass.    Neck:   No meningismus. Full ROM. Hematological/Lymphatic/Immunilogical:   No cervical lymphadenopathy. Cardiovascular:   RRR. Symmetric bilateral radial and DP pulses.  No murmurs.  Respiratory:   Normal respiratory effort without tachypnea/retractions. Breath sounds are clear and equal bilaterally. No wheezes/rales/rhonchi. Gastrointestinal:   Soft and nontender. Non distended. There is no CVA tenderness.  No rebound, rigidity, or guarding.rectal exam reveals copious fresh red blood in the rectal vault. No hemorrhoids. Strongly Hemoccult-positive. Genitourinary:   deferred Musculoskeletal:   Normal range of motion in all extremities. No joint effusions.  No lower extremity tenderness.  No edema. Neurologic:   Normal speech and language.  Motor grossly intact. No acute focal neurologic deficits are appreciated.  Skin:    Skin is warm, dry and intact. No rash noted.  No petechiae, purpura, or bullae.  ____________________________________________    LABS (pertinent positives/negatives) (all labs ordered are listed, but only abnormal results are displayed) Labs Reviewed   COMPREHENSIVE METABOLIC PANEL - Abnormal; Notable for the following components:      Result Value   Glucose, Bld 104 (*)    BUN 24 (*)    Calcium 8.8 (*)    ALT <5 (*)    Total Bilirubin 1.3 (*)    All other components within normal limits  CBC - Abnormal; Notable for the following components:   RBC 4.06 (*)    Hemoglobin 11.7 (*)    HCT 36.0 (*)    RDW 16.1 (*)    Platelets 144 (*)    All other components within normal limits  POC OCCULT BLOOD, ED  TYPE AND  SCREEN   ____________________________________________   EKG  interpreted by me AV dual paced rhythm, rate of 73. Left axis, left bundle branch block. No acute ischemic changes.  ____________________________________________    RADIOLOGY  No results found.  ____________________________________________   PROCEDURES Procedures  ____________________________________________    CLINICAL IMPRESSION / ASSESSMENT AND PLAN / ED COURSE  Pertinent labs & imaging results that were available during my care of the patient were reviewed by me and considered in my medical decision making (see chart for details).     Clinical Course as of Dec 30 1153  Fri Dec 30, 2017  1150 Pt p/w LGIB, not attributable to hemorrhoids.  2 point drop in hemoglobin compared to prior (2017). Currently hemodynamically stable, but with comorbidities, age, status living alone, would plan to hospitalize for serial H/H and GI eval, possible colonoscopy.  [PS]    Clinical Course User Index [PS] Sharman CheekStafford, Bari Handshoe, MD     ____________________________________________   FINAL CLINICAL IMPRESSION(S) / ED DIAGNOSES    Final diagnoses:  Lower GI bleed       Portions of this note were generated with dragon dictation software. Dictation errors may occur despite best attempts at proofreading.    Sharman CheekStafford, Toby Ayad, MD 12/30/17 1155

## 2017-12-30 NOTE — Progress Notes (Signed)
Pt arrived to unit from ED. Pt alert and oriented X4. Skin assessment completed with Asher MuirJamie, RN. Pt on 2L O2 PRN. IV saline locked. SCDs applied. Son at bedside. No complaints from pt at this time.

## 2017-12-30 NOTE — H&P (Signed)
[employer] - [place] at [facility]   PATIENT NAME: Jermaine [name]    MR#:  [id number]  DATE OF BIRTH:  09-05-1929  DATE OF ADMISSION:  12/30/2017  PRIMARY CARE PHYSICIAN: Lauro Regulus, MD   REQUESTING/REFERRING PHYSICIAN: Dr. Scotty Court  CHIEF COMPLAINT:   Chief Complaint  Patient presents with  . Rectal Bleeding    HISTORY OF PRESENT ILLNESS:  Jermaine [name]  is a 84 y.o. male with a known history of hypertension, diabetes mellitus, chronic respiratory failure, Parkinson's on aspirin presents to the emergency room from assisted living facility due to 3 episodes of bloody bowel movements.  This is painless.  No abdominal pain.  No history of hemorrhoids or polyps.  He had he has been constipated and had strained and had a large bowel movement prior to this starting.  Rectal exam in the emergency room showed large amounts of blood in the rectum. No melena.  PAST MEDICAL HISTORY:   Past Medical History:  Diagnosis Date  . CHF (congestive heart failure) (HCC)   . COPD (chronic obstructive pulmonary disease) (HCC)   . Diabetes mellitus without complication (HCC)   . Hypertension   . LVH (left ventricular hypertrophy)   . OSA (obstructive sleep apnea)   . Parkinson's disease (HCC) 2008    PAST SURGICAL HISTORY:   Past Surgical History:  Procedure Laterality Date  . CORONARY ARTERY BYPASS GRAFT  2008   double   . PACEMAKER PLACEMENT N/A 2016    SOCIAL HISTORY:   Social History   Tobacco Use  . Smoking status: Never Smoker  . Smokeless tobacco: Never Used  Substance Use Topics  . Alcohol use: No    Alcohol/week: 0.0 oz    FAMILY HISTORY:   Family History  Problem Relation Age of Onset  . Anemia Neg Hx   . Arrhythmia Neg Hx   . Asthma Neg Hx   . Clotting disorder Neg Hx   . Fainting Neg Hx   . Heart attack Neg Hx   . Heart disease Neg Hx   . Heart failure Neg Hx   . Hyperlipidemia Neg Hx   . Hypertension Neg Hx     DRUG  ALLERGIES:  No Known Allergies  REVIEW OF SYSTEMS:   Review of Systems  Constitutional: Positive for malaise/fatigue. Negative for chills and fever.  HENT: Negative for sore throat.   Eyes: Negative for blurred vision, double vision and pain.  Respiratory: Negative for cough, hemoptysis, shortness of breath and wheezing.   Cardiovascular: Negative for chest pain, palpitations, orthopnea and leg swelling.  Gastrointestinal: Positive for blood in stool. Negative for abdominal pain, constipation, diarrhea, heartburn, nausea and vomiting.  Genitourinary: Negative for dysuria and hematuria.  Musculoskeletal: Negative for back pain and joint pain.  Skin: Negative for rash.  Neurological: Negative for sensory change, speech change, focal weakness and headaches.  Endo/Heme/Allergies: Does not bruise/bleed easily.  Psychiatric/Behavioral: Negative for depression. The patient is not nervous/anxious.     MEDICATIONS AT HOME:   Prior to Admission medications   Medication Sig Start Date End Date Taking? Authorizing Provider  allopurinol (ZYLOPRIM) 300 MG tablet Take 150 mg by mouth daily.    Yes [provider]  aspirin 325 MG tablet Take 325 mg by mouth at bedtime.   Yes [provider]  carbidopa-levodopa (SINEMET IR) 25-250 MG tablet Take 2-2.5 tablets by mouth 2 (two) times daily.    Yes [provider]  carvedilol (COREG) 3.125 MG tablet Take 3.125  mg by mouth 2 (two) times daily with a meal.   Yes [provider]  cholecalciferol (VITAMIN D) 1000 UNITS tablet Take 1,000 Units by mouth daily.   Yes [provider]  Melatonin 5 MG TABS Take 5 mg by mouth at bedtime.    Yes [provider]  polyethylene glycol (MIRALAX / GLYCOLAX) packet Take 17 g by mouth daily as needed for mild constipation.   Yes [provider]  potassium chloride SA (K-DUR,KLOR-CON) 20 MEQ tablet Take 20 mEq by mouth daily.   Yes [provider]   pravastatin (PRAVACHOL) 20 MG tablet Take 20 mg by mouth at bedtime.    Yes [provider]  rOPINIRole (REQUIP) 1 MG tablet Take 1 mg by mouth 2 (two) times daily.    Yes [provider]  tamsulosin (FLOMAX) 0.4 MG CAPS capsule Take 0.4 mg by mouth daily.    [provider]  torsemide (DEMADEX) 20 MG tablet Take 20 mg by mouth daily.    [provider]     VITAL SIGNS:  Blood pressure (!) 112/93, pulse 64, temperature 97.7 F (36.5 C), temperature source Oral, resp. rate 18, height 5\' 8"  (1.727 m), weight 92.1 kg (203 lb), SpO2 97 %.  PHYSICAL EXAMINATION:  Physical Exam  GENERAL:  84 y.o.-year-old patient lying in the bed with no acute distress.  EYES: Pupils equal, round, reactive to light and accommodation. No scleral icterus. Extraocular muscles intact.  HEENT: Head atraumatic, normocephalic. Oropharynx and nasopharynx clear. No oropharyngeal erythema, moist oral mucosa  NECK:  Supple, no jugular venous distention. No thyroid enlargement, no tenderness.  LUNGS: Normal breath sounds bilaterally, no wheezing, rales, rhonchi. No use of accessory muscles of respiration.  CARDIOVASCULAR: S1, S2 normal. No murmurs, rubs, or gallops.  ABDOMEN: Soft, nontender, nondistended. Bowel sounds present. No organomegaly or mass.  EXTREMITIES: No pedal edema, cyanosis, or clubbing. + 2 pedal & radial pulses b/l.   NEUROLOGIC: Cranial nerves II through XII are intact. No focal Motor or sensory deficits appreciated b/l PSYCHIATRIC: The patient is alert and oriented x 3. Good affect.  SKIN: No obvious rash, lesion, or ulcer.   LABORATORY PANEL:   CBC Recent Labs  Lab 12/30/17 1043  WBC 6.5  HGB 11.7*  HCT 36.0*  PLT 144*   ------------------------------------------------------------------------------------------------------------------  Chemistries  Recent Labs  Lab 12/30/17 1043  NA 143  K 4.0  CL 106  CO2 29  GLUCOSE 104*  BUN 24*  CREATININE  0.69  CALCIUM 8.8*  AST 19  ALT <5*  ALKPHOS 64  BILITOT 1.3*   ------------------------------------------------------------------------------------------------------------------  Cardiac Enzymes No results for input(s): TROPONINI in the last 168 hours. ------------------------------------------------------------------------------------------------------------------  RADIOLOGY:  No results found.   IMPRESSION AND PLAN:   *Bright red blood per rectum likely lower GI bleed.  Could be diverticular or internal hemorrhoidal.  Patient will be admitted and put on clear liquid diet.  Follow hemoglobin.  Transfuse if unstable vitals or significant drop in hemoglobin.  Consult GI.  Hold aspirin. Further workup may need a tagged RBC scan or colonoscopy.  *Diabetes mellitus.  Sliding scale insulin.  Home medications held.  *Hypertension.  Continue home medications.  *Chronic respiratory failure.  Continue oxygen and nebulizers as needed.  *DVT prophylaxis with SCDs  All the records are reviewed and case discussed with ED provider. Management plans discussed with the patient, family and they are in agreement.  CODE STATUS: DNR  TOTAL TIME TAKING CARE OF THIS PATIENT: 40  minutes.   Orie Fisherman M.D on 12/30/2017 at 12:24 PM  Between 7am to 6pm - Pager - [phone]  After 6pm go to [email or web address] - password [email or web address] [facility]  [facility] [place] [facility]  Office  [phone]  CC: Primary care physician; Lauro Regulus, MD  Note: This dictation was prepared with Dragon dictation along with smaller phrase technology. Any transcriptional errors that result from this process are unintentional.

## 2017-12-30 NOTE — ED Triage Notes (Signed)
[facility] for reports of rectal bleeding that began this morning. Pt denies history of rectal bleeding or blood in his stools. Denies pain. EMS reports VSS.

## 2017-12-30 NOTE — ED Notes (Signed)
Pt son Jermaine [name] cSherlon Handingan be reached at [phone].

## 2017-12-30 NOTE — ED Notes (Signed)
Pt son, Sherlon HandingChuck Dutton informed that pt will be going to room 144.

## 2017-12-31 DIAGNOSIS — K922 Gastrointestinal hemorrhage, unspecified: Secondary | ICD-10-CM

## 2017-12-31 LAB — GLUCOSE, CAPILLARY
GLUCOSE-CAPILLARY: 118 mg/dL — AB (ref 65–99)
Glucose-Capillary: 85 mg/dL (ref 65–99)
Glucose-Capillary: 84 mg/dL (ref 65–99)
Glucose-Capillary: 105 mg/dL — ABNORMAL HIGH (ref 65–99)

## 2017-12-31 LAB — BASIC METABOLIC PANEL
ANION GAP: 8 (ref 5–15)
BUN: 23 mg/dL — ABNORMAL HIGH (ref 6–20)
CALCIUM: 8.3 mg/dL — AB (ref 8.9–10.3)
CHLORIDE: 105 mmol/L (ref 101–111)
CO2: 29 mmol/L (ref 22–32)
Creatinine, Ser: 0.63 mg/dL (ref 0.61–1.24)
GFR calc non Af Amer: >60 mL/min (ref >60)
GLUCOSE: 95 mg/dL (ref 65–99)
Potassium: 3.3 mmol/L — ABNORMAL LOW (ref 3.5–5.1)
Sodium: 142 mmol/L (ref 135–145)

## 2017-12-31 LAB — CBC
HEMATOCRIT: 27.7 % — AB (ref 40.0–52.0)
HEMOGLOBIN: 9.1 g/dL — AB (ref 13.0–18.0)
MCH: 29.1 pg (ref 26.0–34.0)
MCHC: 32.9 g/dL (ref 32.0–36.0)
MCV: 88.4 fL (ref 80.0–100.0)
Platelets: 111 10*3/uL — ABNORMAL LOW (ref 150–440)
RBC: 3.13 MIL/uL — AB (ref 4.40–5.90)
RDW: 15.7 % — ABNORMAL HIGH (ref 11.5–14.5)
WBC: 4.9 10*3/uL (ref 3.8–10.6)

## 2017-12-31 MED ORDER — POTASSIUM CHLORIDE 10 MEQ/100ML IV SOLN
10.0000 meq | INTRAVENOUS | Status: DC
  Filled 2017-12-31: qty 100

## 2017-12-31 MED ORDER — POLYETHYLENE GLYCOL 3350 17 GM/SCOOP PO POWD
1.0000 | Freq: Once | ORAL | Status: DC
  Filled 2017-12-31: qty 255

## 2017-12-31 MED ORDER — POLYETHYLENE GLYCOL 3350 17 GM/SCOOP PO POWD
1.0000 | Freq: Once | ORAL | Status: AC
  Administered 2017-12-31: 255 g via ORAL
  Filled 2017-12-31: qty 255

## 2017-12-31 MED ORDER — POTASSIUM CHLORIDE CRYS ER 20 MEQ PO TBCR
40.0000 meq | EXTENDED_RELEASE_TABLET | Freq: Once | ORAL | Status: AC
  Administered 2017-12-31: 40 meq via ORAL
  Filled 2017-12-31: qty 2

## 2017-12-31 NOTE — Progress Notes (Signed)
[employer] - [place] at [facility]   PATIENT NAME: Jermaine SableCharles [name]    MR#:  [id number]  DATE OF BIRTH:  1928/12/25  SUBJECTIVE:   Patient had no rectal bleeding this morning. Patient is tired he has not slept very well over the past 2 days  REVIEW OF SYSTEMS:    Review of Systems  Constitutional: Negative for fever, chills weight loss HENT: Negative for ear pain, nosebleeds, congestion, facial swelling, rhinorrhea, neck pain, neck stiffness and ear discharge.   Respiratory: Negative for cough, shortness of breath, wheezing  Cardiovascular: Negative for chest pain, palpitations and leg swelling.  Gastrointestinal: Negative for heartburn, abdominal pain, vomiting, diarrhea or consitpation Positive rectal bleeding Genitourinary: Negative for dysuria, urgency, frequency, hematuria Musculoskeletal: Negative for back pain or joint pain Neurological: Negative for dizziness, seizures, syncope, focal weakness,  numbness and headaches.  He has Parkinson's disease Hematological: Does not bruise/bleed easily.  Psychiatric/Behavioral: Negative for hallucinations, confusion, dysphoric mood    Tolerating Diet: Clear liquid      DRUG ALLERGIES:  No Known Allergies  VITALS:  Blood pressure 115/73, pulse 60, temperature 98.4 F (36.9 C), temperature source Oral, resp. rate 18, height 5\' 8"  (1.727 m), weight 88.8 kg (195 lb 11.2 oz), SpO2 100 %.  PHYSICAL EXAMINATION:  Constitutional: Appears well-developed and well-nourished. No distress. HENT: Normocephalic. Marland Kitchen. Oropharynx is clear and moist.  Eyes: Conjunctivae and EOM are normal. PERRLA, no scleral icterus.  Neck: Normal ROM. Neck supple. No JVD. No tracheal deviation. CVS: RRR, S1/S2 +, no murmurs, no gallops, no carotid bruit.  Pulmonary: Effort and breath sounds normal, no stridor, rhonchi, wheezes, rales.  Abdominal: Soft. BS +,  no distension, tenderness, rebound or guarding.  Musculoskeletal: Normal range of  motion. No edema and no tenderness.  Neuro: Alert. CN 2-12 grossly intact. No focal deficits. Skin: Skin is warm and dry. No rash noted. Psychiatric: Normal mood and affect.      LABORATORY PANEL:   CBC Recent Labs  Lab 12/31/17 0301  WBC 4.9  HGB 9.1*  HCT 27.7*  PLT 111*   ------------------------------------------------------------------------------------------------------------------  Chemistries  Recent Labs  Lab 12/30/17 1043 12/31/17 0301  NA 143 142  K 4.0 3.3*  CL 106 105  CO2 29 29  GLUCOSE 104* 95  BUN 24* 23*  CREATININE 0.69 0.63  CALCIUM 8.8* 8.3*  AST 19  --   ALT <5*  --   ALKPHOS 64  --   BILITOT 1.3*  --    ------------------------------------------------------------------------------------------------------------------  Cardiac Enzymes No results for input(s): TROPONINI in the last 168 hours. ------------------------------------------------------------------------------------------------------------------  RADIOLOGY:  No results found.   ASSESSMENT AND PLAN:   84 year old male with history of diabetes and Parkinson's disease who presents with rectal bleeding.  1. Rectal bleeding: Follow-up with GI consult  2. Acute blood loss anemia: Hemoglobin has dropped slightly Continue to monitor No need for blood transfusion at this point  3. History of Parkinson's disease: Continue Sinemet and amantadine 4. Chronic hypoxic respiratory failure: Continue oxygen  5. Diabetes: Continue sliding scale  6. Hyperlipidemia: Continue statin 7. Hyperkalemia: Repleted 8. Essential hypertension: Continue Coreg  9. Chronic systolic heart failure EF of 20%: No signs of exacerbation Continue torsemide Monitor daily weight and intake and output  10. Restless leg syndrome: Continue Requip   Management plans discussed with the patient and he is in agreement.  CODE STATUS: Full  TOTAL TIME TAKING CARE OF THIS PATIENT: 30 minutes.     POSSIBLE  D/C 1-2 days, DEPENDING ON CLINICAL CONDITION.   Cordell Guercio M.D on 12/31/2017 at 9:07 AM  Between 7am to 6pm - Pager - [phone] After 6pm go to [email or web address] - Social research officer, government  [facility] [place] [facility]  Office  [phone]  CC: Primary care physician; Lauro Regulus, MD  Note: This dictation was prepared with Dragon dictation along with smaller phrase technology. Any transcriptional errors that result from this process are unintentional.

## 2017-12-31 NOTE — Anesthesia Preprocedure Evaluation (Addendum)
Anesthesia Evaluation  Patient identified by MRN, date of birth, ID band Patient awake    Reviewed: Allergy & Precautions, H&P , NPO status , Patient's Chart, lab work & pertinent test results, reviewed documented beta blocker date and time   History of Anesthesia Complications Negative for: history of anesthetic complications  Airway Mallampati: I  TM Distance: >3 FB Neck ROM: full    Dental  (+) Missing, Dental Advidsory Given, Poor Dentition   Pulmonary shortness of breath, with exertion and Long-Term Oxygen Therapy, sleep apnea and Continuous Positive Airway Pressure Ventilation , COPD,  oxygen dependent, neg recent URI,           Cardiovascular Exercise Tolerance: Good hypertension, (-) angina+ CAD, + CABG and +CHF  (-) Past MI and (-) Cardiac Stents (-) dysrhythmias + pacemaker + Valvular Problems/Murmurs      Neuro/Psych neg Seizures PSYCHIATRIC DISORDERS Depression  Neuromuscular disease (Parkinson's disease)    GI/Hepatic negative GI ROS, Neg liver ROS,   Endo/Other  diabetes  Renal/GU negative Renal ROS  negative genitourinary   Musculoskeletal   Abdominal   Peds  Hematology negative hematology ROS (+)   Anesthesia Other Findings Past Medical History: No date: CHF (congestive heart failure) (HCC) No date: COPD (chronic obstructive pulmonary disease) (HCC) No date: Diabetes mellitus without complication (HCC) No date: Hypertension No date: LVH (left ventricular hypertrophy) No date: OSA (obstructive sleep apnea) 2008: Parkinson's disease (HCC)   Reproductive/Obstetrics negative OB ROS                            Anesthesia Physical Anesthesia Plan  ASA: IV  Anesthesia Plan: General   Post-op Pain Management:    Induction: Intravenous  PONV Risk Score and Plan: 2 and Propofol infusion  Airway Management Planned: Nasal Cannula  Additional Equipment:   Intra-op Plan:    Post-operative Plan:   Informed Consent: I have reviewed the patients History and Physical, chart, labs and discussed the procedure including the risks, benefits and alternatives for the proposed anesthesia with the patient or authorized representative who has indicated his/her understanding and acceptance.   Dental Advisory Given  Plan Discussed with: Anesthesiologist, CRNA and Surgeon  Anesthesia Plan Comments:         Anesthesia Quick Evaluation

## 2017-12-31 NOTE — Progress Notes (Signed)
Pt refuses to drink anymore of the bowel prep. Completed half of bottle. Pt c/o "not feeling well and unable to tolerate anymore". No bowel movement at this time. Will continue to monitor.

## 2017-12-31 NOTE — Consult Note (Signed)
Midge Minium, MD [facility]  [address], [address] [place], [place] [address] Phone: [phone] Fax : [phone]  Consultation  Referring Provider:     Dr. Elpidio Anis Primary Care Physician:  Lauro Regulus, MD Primary Gastroenterologist:     Seward Speck Reason for Consultation:     Hematochezia  Date of Admission:  12/30/2017 Date of Consultation:  12/31/2017         HPI:   Jermaine [name]. is a 84 y.o. male who reports that he is usually constipated. The patient states that he has constipation that will go on for up to 8 days. He states that he had a colonoscopy a few years ago that he reports to be normal. After being constipated he states that he had a very hard bowel movement with a large amount of blood. The patient states that 70-80% of the passage of substance from his rectum was all blood. He said he had 3 episodes of the bloody bowel movements. There was no pain associated with the rectal bleeding. He also denies any rectal bleeding prior to this. The patient lives in an assisted living facility and reports that he is on aspirin. The patient's hemoglobin on admission was 11.7 that went down to 9.1 this morning. His baseline has been as high as 13-14.  Past Medical History:  Diagnosis Date  . CHF (congestive heart failure) (HCC)   . COPD (chronic obstructive pulmonary disease) (HCC)   . Diabetes mellitus without complication (HCC)   . Hypertension   . LVH (left ventricular hypertrophy)   . OSA (obstructive sleep apnea)   . Parkinson's disease (HCC) 2008    Past Surgical History:  Procedure Laterality Date  . CORONARY ARTERY BYPASS GRAFT  2008   double   . PACEMAKER PLACEMENT N/A 2016    Prior to Admission medications   Medication Sig Start Date End Date Taking? Authorizing Provider  allopurinol (ZYLOPRIM) 300 MG tablet Take 150 mg by mouth daily.    Yes [provider]  Amantadine HCl 100 MG tablet Take 100 mg by mouth 2 (two) times daily. Take with  CARBIDOPA-LEVODOPA 11/16/17  Yes [provider]  aspirin 325 MG tablet Take 325 mg by mouth at bedtime.   Yes [provider]  carbidopa-levodopa (SINEMET IR) 25-250 MG tablet Take 2-2.5 tablets by mouth 2 (two) times daily.    Yes [provider]  carvedilol (COREG) 3.125 MG tablet Take 3.125 mg by mouth 2 (two) times daily with a meal.   Yes [provider]  cholecalciferol (VITAMIN D) 1000 UNITS tablet Take 1,000 Units by mouth daily.   Yes [provider]  Melatonin 5 MG TABS Take 5 mg by mouth at bedtime.    Yes [provider]  polyethylene glycol (MIRALAX / GLYCOLAX) packet Take 17 g by mouth daily as needed for mild constipation.   Yes [provider]  potassium chloride SA (K-DUR,KLOR-CON) 20 MEQ tablet Take 20 mEq by mouth daily.   Yes [provider]  pravastatin (PRAVACHOL) 20 MG tablet Take 20 mg by mouth at bedtime.    Yes [provider]  rOPINIRole (REQUIP) 1 MG tablet Take 1 mg by mouth 2 (two) times daily.    Yes [provider]  torsemide (DEMADEX) 20 MG tablet Take 20 mg by mouth daily.   Yes [provider]    Family History  Problem Relation Age of Onset  . Anemia Neg Hx   . Arrhythmia Neg  Hx   . Asthma Neg Hx   . Clotting disorder Neg Hx   . Fainting Neg Hx   . Heart attack Neg Hx   . Heart disease Neg Hx   . Heart failure Neg Hx   . Hyperlipidemia Neg Hx   . Hypertension Neg Hx      Social History   Tobacco Use  . Smoking status: Never Smoker  . Smokeless tobacco: Never Used  Substance Use Topics  . Alcohol use: No    Alcohol/week: 0.0 oz  . Drug use: No    Allergies as of 12/30/2017  . (No Known Allergies)    Review of Systems:    All systems reviewed and negative except where noted in HPI.   Physical Exam:  Vital signs in last 24 hours: Temp:  [97.4 F (36.3 C)-98.5 F (36.9 C)] 98.4 F (36.9 C) (01/19 0807) Pulse Rate:  [59-82] 60 (01/19  0807) Resp:  [18-23] 18 (01/19 0807) BP: (96-155)/(48-73) 115/73 (01/19 0807) SpO2:  [94 %-100 %] 100 % (01/19 0807) Weight:  [195 lb 11.2 oz (88.8 kg)-204 lb 14.4 oz (92.9 kg)] 195 lb 11.2 oz (88.8 kg) (01/19 0500) Last BM Date: 12/30/17 General:   Pleasant, cooperative in NAD Head:  Normocephalic and atraumatic. Eyes:   No icterus.   Conjunctiva pink. PERRLA. Ears:  Normal auditory acuity. Neck:  Supple; no masses or thyroidomegaly Lungs: Respirations even and unlabored. Lungs clear to auscultation bilaterally.   No wheezes, crackles, or rhonchi.  Heart:  Regular rate and rhythm;  Without murmur, clicks, rubs or gallops Abdomen:  Soft, nondistended, nontender. Normal bowel sounds. No appreciable masses or hepatomegaly.  No rebound or guarding.  Rectal:  Not performed but diaper filled with fresh blood. Msk:  Symmetrical without gross deformities.    Extremities:  Without edema, cyanosis or clubbing. Neurologic:  Alert and oriented x3;  grossly normal neurologically. Skin:  Intact without significant lesions or rashes. Cervical Nodes:  No significant cervical adenopathy. Psych:  Alert and cooperative. Normal affect.  LAB RESULTS: Recent Labs    12/30/17 1043 12/31/17 0301  WBC 6.5 4.9  HGB 11.7* 9.1*  HCT 36.0* 27.7*  PLT 144* 111*   BMET Recent Labs    12/30/17 1043 12/31/17 0301  NA 143 142  K 4.0 3.3*  CL 106 105  CO2 29 29  GLUCOSE 104* 95  BUN 24* 23*  CREATININE 0.69 0.63  CALCIUM 8.8* 8.3*   LFT Recent Labs    12/30/17 1043  PROT 6.6  ALBUMIN 3.6  AST 19  ALT <5*  ALKPHOS 64  BILITOT 1.3*   PT/INR No results for input(s): LABPROT, INR in the last 72 hours.  STUDIES: No results found.    Impression / Plan:   Jermaine ColaceCharles G Mathisen Sr. is a 84 y.o. y/o male with rectal bleeding with 3 episodes yesterday and a drop of his hemoglobin from 11.7-9.1 this morning. The patient will be set up for colonoscopy for tomorrow. The patient will take a prep tonight  and then be nothing by mouth after midnight. The patient should have his hemoglobin monitored. I have discussed risks & benefits which include, but are not limited to, bleeding, infection, perforation & drug reaction.  The patient agrees with this plan & written consent will be obtained.      Thank you for involving me in the care of this patient.      LOS: 1 day   Midge Miniumarren Seriyah Collison, MD  12/31/2017, 11:06 AM  Note: This dictation was prepared with Dragon dictation along with smaller phrase technology. Any transcriptional errors that result from this process are unintentional.

## 2018-01-01 ENCOUNTER — Inpatient Hospital Stay: Payer: Medicare Other | Admitting: Anesthesiology

## 2018-01-01 ENCOUNTER — Encounter: Payer: Self-pay | Admitting: Anesthesiology

## 2018-01-01 ENCOUNTER — Encounter
Admission: EM | Disposition: A | Discharge status: Skilled Nursing Facility | Payer: Self-pay | Source: Home / Self Care | Attending: Internal Medicine

## 2018-01-01 LAB — GLUCOSE, CAPILLARY
GLUCOSE-CAPILLARY: 63 mg/dL — AB (ref 65–99)
GLUCOSE-CAPILLARY: 89 mg/dL (ref 65–99)
GLUCOSE-CAPILLARY: 86 mg/dL (ref 65–99)
Glucose-Capillary: 163 mg/dL — ABNORMAL HIGH (ref 65–99)
Glucose-Capillary: 90 mg/dL (ref 65–99)

## 2018-01-01 LAB — CBC
HCT: 29 % — ABNORMAL LOW (ref 40.0–52.0)
HEMOGLOBIN: 9.6 g/dL — AB (ref 13.0–18.0)
MCH: 29.3 pg (ref 26.0–34.0)
MCHC: 33 g/dL (ref 32.0–36.0)
MCV: 88.8 fL (ref 80.0–100.0)
Platelets: 127 10*3/uL — ABNORMAL LOW (ref 150–440)
RBC: 3.27 MIL/uL — AB (ref 4.40–5.90)
RDW: 16.1 % — ABNORMAL HIGH (ref 11.5–14.5)
WBC: 8.4 10*3/uL (ref 3.8–10.6)

## 2018-01-01 LAB — BASIC METABOLIC PANEL
Anion gap: 8 (ref 5–15)
BUN: 21 mg/dL — AB (ref 6–20)
CHLORIDE: 105 mmol/L (ref 101–111)
CO2: 26 mmol/L (ref 22–32)
Calcium: 8.8 mg/dL — ABNORMAL LOW (ref 8.9–10.3)
Creatinine, Ser: 0.83 mg/dL (ref 0.61–1.24)
GFR calc Af Amer: >60 mL/min (ref >60)
GFR calc non Af Amer: >60 mL/min (ref >60)
GLUCOSE: 91 mg/dL (ref 65–99)
POTASSIUM: 3.9 mmol/L (ref 3.5–5.1)
Sodium: 139 mmol/L (ref 135–145)

## 2018-01-01 SURGERY — COLONOSCOPY WITH PROPOFOL
Anesthesia: General

## 2018-01-01 MED ORDER — NICOTINE 21 MG/24HR TD PT24: 21.0000 mg | MEDICATED_PATCH | Freq: Every day | TRANSDERMAL | Status: DC

## 2018-01-01 MED ORDER — POLYETHYLENE GLYCOL 3350 17 GM/SCOOP PO POWD
1.0000 | Freq: Once | ORAL | Status: AC
  Filled 2018-01-01: qty 255

## 2018-01-01 MED ORDER — DEXTROSE 50 % IV SOLN
INTRAVENOUS | Status: AC
  Administered 2018-01-01: 50 mL
  Filled 2018-01-01: qty 50

## 2018-01-01 MED ORDER — PEG 3350-KCL-NA BICARB-NACL 420 G PO SOLR
4000.0000 mL | Freq: Once | ORAL | Status: AC
  Administered 2018-01-01: 4000 mL via ORAL
  Filled 2018-01-01 (×2): qty 4000

## 2018-01-01 NOTE — OR Nursing (Signed)
Pt to procedure room unable to perform procedure due to poor prep. Pt was not sedated. Report called to nurse on unit.

## 2018-01-01 NOTE — Addendum Note (Signed)
Addendum  created 01/01/18 1327 by Lenard SimmerKarenz, Haelie Clapp, MD   Sign clinical note

## 2018-01-01 NOTE — Progress Notes (Signed)
[employer] [facility] [place] [facility]   PATIENT NAME: Jermaine [name]    MR#:  [id number]  DATE OF BIRTH:  [birth year]  SUBJECTIVE:   Nurse reports that patient was very confused at night. Patient is oriented this morning. Not in respiratory distress. He did receive nebulizer treatment yesterday for wheezing.  He finished prep and still has bloody bowel movements  REVIEW OF SYSTEMS:    Review of Systems  Constitutional: Negative for fever, chills weight loss HENT: Negative for ear pain, nosebleeds, congestion, facial swelling, rhinorrhea, neck pain, neck stiffness and ear discharge.   Respiratory: Negative for cough, shortness of breath, wheezing  Cardiovascular: Negative for chest pain, palpitations and leg swelling.  Gastrointestinal: Negative for heartburn, abdominal pain, vomiting, diarrhea or consitpation Positive rectal bleeding/bloody bowel movements Genitourinary: Negative for dysuria, urgency, frequency, hematuria Musculoskeletal: Negative for back pain or joint pain Neurological: Negative for dizziness, seizures, syncope, focal weakness,  numbness and headaches.  He has Parkinson's disease Hematological: Does not bruise/bleed easily.  Psychiatric/Behavioral: Positive for confusion overnight    Tolerating Diet: Clear liquid      DRUG ALLERGIES:  No Known Allergies  VITALS:  Blood pressure (!) 119/49, pulse 64, temperature 98.6 F (37 C), temperature source Oral, resp. rate 19, height 5\' 8"  (1.727 m), weight 100.2 kg (221 lb), SpO2 100 %.  PHYSICAL EXAMINATION:  Constitutional: Appears well-developed and well-nourished. No distress. HENT: Normocephalic. Marland Kitchen Oropharynx is clear and moist.  Eyes: Conjunctivae and EOM are normal. PERRLA, no scleral icterus.  Neck: Normal ROM. Neck supple. No JVD. No tracheal deviation. CVS: RRR, S1/S2 +, no murmurs, no gallops, no carotid bruit.  Pulmonary: Effort and breath sounds normal, no stridor, rhonchi, wheezes,  rales.  Abdominal: Soft. BS +,  no distension, tenderness, rebound or guarding.  Musculoskeletal: Normal range of motion. No edema and no tenderness.  Neuro: Alert. CN 2-12 grossly intact. No focal deficits. Skin: Skin is warm and dry. No rash noted. Psychiatric: Normal mood and affect.      LABORATORY PANEL:   CBC Recent Labs  Lab 01/01/18 0611  WBC 8.4  HGB 9.6*  HCT 29.0*  PLT 127*   ------------------------------------------------------------------------------------------------------------------  Chemistries  Recent Labs  Lab 12/30/17 1043  01/01/18 0611  NA 143   < > 139  K 4.0   < > 3.9  CL 106   < > 105  CO2 29   < > 26  GLUCOSE 104*   < > 91  BUN 24*   < > 21*  CREATININE 0.69   < > 0.83  CALCIUM 8.8*   < > 8.8*  AST 19  --   --   ALT <5*  --   --   ALKPHOS 64  --   --   BILITOT 1.3*  --   --    < > = values in this interval not displayed.   ------------------------------------------------------------------------------------------------------------------  Cardiac Enzymes No results for input(s): TROPONINI in the last 168 hours. ------------------------------------------------------------------------------------------------------------------  RADIOLOGY:  No results found.   ASSESSMENT AND PLAN:   84 year old male with history of diabetes and Parkinson's disease who presents with rectal bleeding.  1. Rectal bleeding: Planned for colonoscopy today  2. Acute blood loss anemia: Hemoglobin has dropped slightly on admission however stable today. Continue to monitor No need for blood transfusion at this point  3. History of Parkinson's disease: Continue Sinemet and amantadine 4. Chronic hypoxic respiratory failure: Continue oxygen  5. Diabetes: Continue sliding scale  6. Hyperlipidemia: Continue statin 7. Hypokalemia: Resolved 8. Essential hypertension: Continue Coreg  9. Chronic systolic heart failure EF of 20%: No signs of  exacerbation Continue torsemide Monitor daily weight and intake and output  10. Restless leg syndrome: Continue Requip   Management plans discussed with the patient and he is in agreement.  CODE STATUS: Full  TOTAL TIME TAKING CARE OF THIS PATIENT: 24 minutes.     POSSIBLE D/C 1-2 days, DEPENDING ON CLINICAL CONDITION.   Petina Muraski M.D on 01/01/2018 at 8:35 AM  Between 7am to 6pm - Pager - [phone] After 6pm go to [email or web address] - Social research officer, governmentpassword [email or web address] [email or web address]  [facility]  Office  [phone]  CC: Primary care physician; Lauro RegulusAnderson, [name], MD  Note: This dictation was prepared with Dragon dictation along with smaller phrase technology. Any transcriptional errors that result from this process are unintentional.

## 2018-01-01 NOTE — Anesthesia Postprocedure Evaluation (Addendum)
Anesthesia Post Note  Patient: Earlyne IbaCharles G Merlin Sr.  Procedure(s) Performed: COLONOSCOPY WITH PROPOFOL (N/A )  Patient location during evaluation: Endoscopy Anesthesia plan: Patient received no medicine prior to case cancellation. Level of consciousness: awake and alert Pain management: pain level controlled Vital Signs Assessment: post-procedure vital signs reviewed and stable Respiratory status: spontaneous breathing, nonlabored ventilation, respiratory function stable and patient connected to nasal cannula oxygen Cardiovascular status: blood pressure returned to baseline and stable Postop Assessment: no apparent nausea or vomiting Anesthetic complications: no     Last Vitals:  Vitals:   01/01/18 0844 01/01/18 0912  BP: (!) 141/62 133/64  Pulse: 69 71  Resp: 18 18  Temp: 36.9 C 36.4 C  SpO2: 100% 97%    Last Pain:  Vitals:   01/01/18 0912  TempSrc: Tympanic                 Lenard SimmerAndrew Suhaila Troiano

## 2018-01-01 NOTE — Progress Notes (Signed)
SVN attempted at RN request for wheezing. Unable to complete tx. Pt confused/agitated & kept pulling the mask off.

## 2018-01-01 NOTE — Anesthesia Post-op Follow-up Note (Signed)
Anesthesia QCDR form completed.        

## 2018-01-01 NOTE — Progress Notes (Signed)
The patient came to endoscopy today for a colonoscopy. The patient's diaper was filled with maroon solid and liquid stools. The patient will be rescheduled for a colonoscopy for tomorrow and will also have an upper endoscopy added due to the color of the stools.

## 2018-01-01 NOTE — Progress Notes (Signed)
PT Cancellation Note  Patient Details Name: Jermaine IbaCharles G Mortellaro Sr. MRN: [id number] DOB: Dec 24, 1928   Cancelled Treatment:    Reason Eval/Treat Not Completed: Patient declined, no reason specified Pt very pleasant while PT was attempting to convince him to participate with PT exam, but he simply continued to state that he is not feeling well enough to participate today.    Malachi ProGalen R Chastin Riesgo, DPT 01/01/2018, 3:11 PM

## 2018-01-01 NOTE — Transfer of Care (Signed)
Immediate Anesthesia Transfer of Care Note  Patient: Jermaine Ibaharles G Yardley Sr.  Procedure(s) Performed: COLONOSCOPY WITH PROPOFOL (N/A )  Patient Location: PACU  Anesthesia Type:case cancelled  Level of Consciousness: awake, alert  and oriented  Airway & Oxygen Therapy: Patient Spontanous Breathing  Post-op Assessment: Report given to RN  Post vital signs: stable  Last Vitals:  Vitals:   01/01/18 0844 01/01/18 0912  BP: (!) 141/62 133/64  Pulse: 69 71  Resp: 18 18  Temp: 36.9 C 36.4 C  SpO2: 100% 97%    Last Pain:  Vitals:   01/01/18 0912  TempSrc: Tympanic         Complications: No apparent anesthesia complications

## 2018-01-01 NOTE — Progress Notes (Signed)
Spoke to pharmacy earlier in the day regarding the bowel prep for patient. They told me it was ready for me to pick up. This nurse went to pick up after lunch and it was not there;pharmacy said it went out on med cart. Still not here, called and spoke to WyndhamStephanie who said they would run another ticket and get another dose ready for patient.

## 2018-01-02 ENCOUNTER — Encounter
Admission: RE | Admit: 2018-01-02 | Discharge: 2018-01-02 | Disposition: A | Discharge status: Home / Self Care | Payer: Medicare Other | Source: Ambulatory Visit | Attending: Internal Medicine | Admitting: Internal Medicine

## 2018-01-02 ENCOUNTER — Inpatient Hospital Stay: Payer: Medicare Other | Admitting: Anesthesiology

## 2018-01-02 ENCOUNTER — Encounter
Admission: EM | Disposition: A | Discharge status: Skilled Nursing Facility | Payer: Self-pay | Source: Home / Self Care | Attending: Internal Medicine

## 2018-01-02 DIAGNOSIS — K259 Gastric ulcer, unspecified as acute or chronic, without hemorrhage or perforation: Secondary | ICD-10-CM

## 2018-01-02 DIAGNOSIS — K2970 Gastritis, unspecified, without bleeding: Principal | ICD-10-CM

## 2018-01-02 DIAGNOSIS — K921 Melena: Secondary | ICD-10-CM

## 2018-01-02 DIAGNOSIS — D649 Anemia, unspecified: Secondary | ICD-10-CM | POA: Insufficient documentation

## 2018-01-02 DIAGNOSIS — K269 Duodenal ulcer, unspecified as acute or chronic, without hemorrhage or perforation: Secondary | ICD-10-CM

## 2018-01-02 HISTORY — PX: COLONOSCOPY WITH PROPOFOL: SHX5780

## 2018-01-02 HISTORY — PX: ESOPHAGOGASTRODUODENOSCOPY (EGD) WITH PROPOFOL: SHX5813

## 2018-01-02 LAB — CBC
HEMATOCRIT: 28.7 % — AB (ref 40.0–52.0)
Hemoglobin: 9.4 g/dL — ABNORMAL LOW (ref 13.0–18.0)
MCH: 29 pg (ref 26.0–34.0)
MCHC: 32.7 g/dL (ref 32.0–36.0)
MCV: 88.8 fL (ref 80.0–100.0)
Platelets: 123 10*3/uL — ABNORMAL LOW (ref 150–440)
RBC: 3.23 MIL/uL — ABNORMAL LOW (ref 4.40–5.90)
RDW: 16.2 % — AB (ref 11.5–14.5)
WBC: 6 10*3/uL (ref 3.8–10.6)

## 2018-01-02 LAB — GLUCOSE, CAPILLARY
GLUCOSE-CAPILLARY: 80 mg/dL (ref 65–99)
GLUCOSE-CAPILLARY: 76 mg/dL (ref 65–99)
Glucose-Capillary: 83 mg/dL (ref 65–99)

## 2018-01-02 SURGERY — ESOPHAGOGASTRODUODENOSCOPY (EGD) WITH PROPOFOL
Anesthesia: General

## 2018-01-02 MED ORDER — PROPOFOL 10 MG/ML IV BOLUS
INTRAVENOUS | Status: DC | PRN
  Administered 2018-01-02: 60 mg via INTRAVENOUS

## 2018-01-02 MED ORDER — SODIUM CHLORIDE 0.9 % IV SOLN: INTRAVENOUS | Status: DC

## 2018-01-02 MED ORDER — PROPOFOL 500 MG/50ML IV EMUL
INTRAVENOUS | Status: DC | PRN
  Administered 2018-01-02: 140 ug/kg/min via INTRAVENOUS

## 2018-01-02 MED ORDER — PEG 3350-KCL-NA BICARB-NACL 420 G PO SOLR
4000.0000 mL | Freq: Once | ORAL | Status: DC
  Filled 2018-01-02 (×3): qty 4000

## 2018-01-02 NOTE — Progress Notes (Signed)
Physical Therapy Evaluation Patient Details Name: Jermaine CLAPPER Sr. MRN: [id number] DOB: 1929-11-26 Today's Date: 01/02/2018   History of Present Illness  Jermaine [name]  is a 84 y.o. male with a known history of hypertension, diabetes mellitus, chronic respiratory failure, Parkinson's on aspirin presented to the emergency room from independent facility due to 3 episodes of bloody bowel movements.  No abdominal pain.  No history of hemorrhoids or polyps. He has been constipated, strained, and had a large bowel movement prior to this starting. Rectal exam in the emergency room showed large amounts of blood in the rectum. No melena. Pt currently admitted and awaiting transport for EGD and possible colonoscopy. He has concerns about being safe returning home alone.  Clinical Impression  Pt admitted with above diagnosis. Pt currently with functional limitations due to the deficits listed below (see PT Problem List).  Pt requires minA+1 for LE assist when returning to bed. Received sitting upright at EOB after using BSC with RN. Patient requires multiple attempts to come to standing initially. Min assist from therapist due to posterior loss of balance. Cues for safe hand placement. Once upright patient is stable in standing with bilateral upper extremity support on rolling walker. Patient ambulates with short shuffling steps in room. He remains on 3 L/m of O2 throughout ambulation. Pulse ox remains greater than 95% and patient denies DOE however increased respiration rate noted. Patient complains of fatigue. At baseline he is on 3L/min O2. Patient is mildly unsteady with ambulation requiring min assist from therapist to stabilize. Generalized lower extremity weakness noted. Patient is too unsteady and weak to return to independent living facility alone at this time. He will need skilled nursing facility placement to facilitate safe transition back to his apartment. Pt will benefit from PT services to address  deficits in strength, balance, and mobility in order to return to full function at home.       Follow Up Recommendations SNF    Equipment Recommendations  None recommended by PT;Other (comment)(May need rolling walker prior to returning home)    Recommendations for Other Services       Precautions / Restrictions Precautions Precautions: Fall Restrictions Weight Bearing Restrictions: No      Mobility  Bed Mobility Overal bed mobility: Needs Assistance Bed Mobility: Sit to Supine       Sit to supine: Min assist   General bed mobility comments: Pt requires minA+1 to return legs to bed. Received sitting upright at EOB after using BSC with RN  Transfers Overall transfer level: Needs assistance Equipment used: Rolling walker (2 wheeled) Transfers: Sit to/from Stand Sit to Stand: Min assist         General transfer comment: Patient requires multiple times to come to standing initially. Min assist from therapist due to posterior loss of balance. Cues for safe hand placement. Once upright patient is stable in standing with bilateral upper extremity support on rolling walker.  Ambulation/Gait Ambulation/Gait assistance: Min assist Ambulation Distance (Feet): 30 Feet Assistive device: Rolling walker (2 wheeled)   Gait velocity: Decreased   General Gait Details: Patient ambulates with short shuffling steps in room. He remains on 3 L/m of O2 throughout ambulation. Pulse ox remains greater than 95% and patient denies DOE however increased respiration rate noted. Patient complains of fatigue. Patient is mildly unsteady with ambulation requiring min assist from therapist to stabilize.  Stairs            Wheelchair Mobility    Modified Rankin (Stroke Patients  Only)       Balance Overall balance assessment: Needs assistance Sitting-balance support: No upper extremity supported Sitting balance-Leahy Scale: Good     Standing balance support: No upper extremity  supported Standing balance-Leahy Scale: Fair Standing balance comment: Patient is able to maintain his balance with feet apart. He is unable to place his feet together without holding onto walker. Once he has his feet together he loses his balance within 5-8 seconds.                              Pertinent Vitals/Pain Pain Assessment: No/denies pain    Home Living Family/patient expects to be discharged to:: Other (Comment)([facility]) Living Arrangements: Alone Available Help at Discharge: Family Type of Home: Apartment Home Access: Level entry     Home Layout: One level Home Equipment: Environmental consultant - 4 wheels;Shower seat - built in;Hospital bed;Other (comment)(Home O2 at 3L/min)      Prior Function Level of Independence: Needs assistance   Gait / Transfers Assistance Needed: Pt ambulates limited community distances with rollator. Denies falls in the last 12 months  ADL's / Homemaking Assistance Needed: Independent with ADLs but requires assist for IADLs        Hand Dominance   Dominant Hand: Right    Extremity/Trunk Assessment   Upper Extremity Assessment Upper Extremity Assessment: RUE deficits/detail RUE Deficits / Details: Chronic R shoulder flexion weakness. Otherwise bilateral UE strength is grossly [facility]    Lower Extremity Assessment Lower Extremity Assessment: Generalized weakness RLE Deficits / Details: Pt with bilateral LE weakness as observed functionally with transfers and ambulation. 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       Communication   Communication: No difficulties  Cognition Arousal/Alertness: Awake/alert Behavior During Therapy: WFL for tasks assessed/performed Overall Cognitive Status: Within Functional Limits for tasks assessed                                 General Comments: AOx4      General Comments      Exercises     Assessment/Plan    PT Assessment Patient needs continued PT  services  PT Problem List Decreased strength;Decreased activity tolerance;Decreased balance;Decreased mobility       PT Treatment Interventions DME instruction;Gait training;Functional mobility training;Therapeutic activities;Balance training;Therapeutic exercise;Neuromuscular re-education;Patient/family education    PT Goals (Current goals can be found in the Care Plan section)  Acute Rehab PT Goals Patient Stated Goal: Return to prior level of function at home PT Goal Formulation: With patient Time For Goal Achievement: 01/16/18 Potential to Achieve Goals: Good    Frequency Min 2X/week   Barriers to discharge Decreased caregiver support Lives alone    Co-evaluation               AM-PAC PT "6 Clicks" Daily Activity  Outcome Measure Difficulty turning over in bed (including adjusting bedclothes, sheets and blankets)?: Unable Difficulty moving from lying on back to sitting on the side of the bed? : Unable Difficulty sitting down on and standing up from a chair with arms (e.g., wheelchair, bedside commode, etc,.)?: Unable Help needed moving to and from a bed to chair (including a wheelchair)?: A Little Help needed walking in hospital room?: A Little Help needed climbing 3-5 steps with a railing? : A Lot 6 Click Score: 11    End of Session Equipment Utilized During Treatment:  Gait belt;Oxygen;Other (comment)(3L/min) Activity Tolerance: Patient tolerated treatment well Patient left: in bed;with call bell/phone within reach;with bed alarm set Nurse Communication: Mobility status PT Visit Diagnosis: Unsteadiness on feet (R26.81);Muscle weakness (generalized) (M62.81);Difficulty in walking, not elsewhere classified (R26.2)    Time: [id number] PT Time Calculation (min) (ACUTE ONLY): 19 min   Charges:   PT Evaluation $PT Eval Low Complexity: 1 Low     PT G Codes:        Sharalyn InkJason D Wilho Sharpley PT, DPT    Jermaine [name] 01/02/2018, 10:37 AM

## 2018-01-02 NOTE — Clinical Social Work Placement (Signed)
   CLINICAL SOCIAL WORK PLACEMENT  NOTE  Date:  01/02/2018  Patient Details  Name: Earlyne IbaCharles G Joss Sr. MRN: [id number] Date of Birth: [birth year]  Clinical Social Work is seeking post-discharge placement for this patient at the Skilled  Nursing Facility level of care (*CSW will initial, date and re-position this form in  chart as items are completed):  Yes   Patient/family provided with [facility] Social Work Department's list of facilities offering this level of care within the geographic area requested by the patient (or if unable, by the patient's family).  Yes   Patient/family informed of their freedom to choose among providers that offer the needed level of care, that participate in Medicare, Medicaid or managed care program needed by the patient, have an available bed and are willing to accept the patient.  Yes   Patient/family informed of [facility]'s ownership interest in [facility] and [facility], as well as of the fact that they are under no obligation to receive care at these facilities.  PASRR submitted to EDS on       PASRR number received on       Existing PASRR number confirmed on 01/02/18     FL2 transmitted to all facilities in geographic area requested by pt/family on 01/02/18     FL2 transmitted to all facilities within larger geographic area on       Patient informed that his/her managed care company has contracts with or will negotiate with certain facilities, including the following:            Patient/family informed of bed offers received.  Patient chooses bed at       Physician recommends and patient chooses bed at      Patient to be transferred to   on  .  Patient to be transferred to facility by       Patient family notified on   of transfer.  Name of family member notified:        PHYSICIAN       Additional Comment:    _______________________________________________ Mirranda Monrroy, Darleen CrockerBailey M, LCSW 01/02/2018, 4:02 PM

## 2018-01-02 NOTE — Anesthesia Preprocedure Evaluation (Signed)
Anesthesia Evaluation  Patient identified by MRN, date of birth, ID band Patient awake    Reviewed: Allergy & Precautions, NPO status , Patient's Chart, lab work & pertinent test results  History of Anesthesia Complications Negative for: history of anesthetic complications  Airway Mallampati: II  TM Distance: >3 FB Neck ROM: Full    Dental  (+) Missing, Poor Dentition, Implants   Pulmonary sleep apnea , COPD,  oxygen dependent,    breath sounds clear to auscultation- rhonchi (-) wheezing      Cardiovascular hypertension, Pt. on medications + CAD, + CABG (2008) and +CHF (EF 20%)   Rhythm:Regular Rate:Normal - Systolic murmurs and - Diastolic murmurs Echo 02/10/17: SEVERE LV SYSTOLIC DYSFUNCTION (EF 20%) WITH MILD LVH NORMAL RIGHT VENTRICULAR SYSTOLIC FUNCTION SEVERE VALVULAR REGURGITATION (severe MR) NO VALVULAR STENOSIS   Neuro/Psych PSYCHIATRIC DISORDERS Depression Parkinson's disease     GI/Hepatic negative GI ROS, Neg liver ROS,   Endo/Other  diabetes, Oral Hypoglycemic Agents  Renal/GU      Musculoskeletal negative musculoskeletal ROS (+)   Abdominal (+) + obese,   Peds  Hematology negative hematology ROS (+)   Anesthesia Other Findings Past Medical History: No date: CHF (congestive heart failure) (HCC) No date: COPD (chronic obstructive pulmonary disease) (HCC) No date: Diabetes mellitus without complication (HCC) No date: Hypertension No date: LVH (left ventricular hypertrophy) No date: OSA (obstructive sleep apnea) 2008: Parkinson's disease (HCC)   Reproductive/Obstetrics                             Anesthesia Physical  Anesthesia Plan  ASA: III  Anesthesia Plan: General   Post-op Pain Management:    Induction: Intravenous  PONV Risk Score and Plan: 1 and Propofol infusion  Airway Management Planned: Natural Airway  Additional Equipment:   Intra-op Plan:    Post-operative Plan:   Informed Consent: I have reviewed the patients History and Physical, chart, labs and discussed the procedure including the risks, benefits and alternatives for the proposed anesthesia with the patient or authorized representative who has indicated his/her understanding and acceptance.   Dental advisory given  Plan Discussed with: CRNA and Anesthesiologist  Anesthesia Plan Comments:         Anesthesia Quick Evaluation  

## 2018-01-02 NOTE — Anesthesia Post-op Follow-up Note (Signed)
Anesthesia QCDR form completed.        

## 2018-01-02 NOTE — Transfer of Care (Signed)
Immediate Anesthesia Transfer of Care Note  Patient: Jermaine IbaCharles G Oshea Sr.  Procedure(s) Performed: ESOPHAGOGASTRODUODENOSCOPY (EGD) WITH PROPOFOL (N/A ) COLONOSCOPY WITH PROPOFOL (N/A )  Patient Location: PACU  Anesthesia Type:General  Level of Consciousness: awake, alert  and oriented  Airway & Oxygen Therapy: Patient Spontanous Breathing and Patient connected to nasal cannula oxygen  Post-op Assessment: Report given to RN and Post -op Vital signs reviewed and stable  Post vital signs: Reviewed and stable  Last Vitals:  Vitals:   01/02/18 1118 01/02/18 1119  BP: 126/62 126/62  Pulse: 73 60  Resp: 20 12  Temp: (!) 36.4 C (!) 36.4 C  SpO2: 97% 100%    Last Pain:  Vitals:   01/02/18 1118  TempSrc: Tympanic         Complications: No apparent anesthesia complications

## 2018-01-02 NOTE — Anesthesia Postprocedure Evaluation (Signed)
Anesthesia Post Note  Patient: Jermaine [name].  Procedure(s) Performed: ESOPHAGOGASTRODUODENOSCOPY (EGD) WITH PROPOFOL (N/A ) COLONOSCOPY WITH PROPOFOL (N/A )  Patient location during evaluation: Endoscopy Anesthesia Type: General Level of consciousness: awake and alert and oriented Pain management: pain level controlled Vital Signs Assessment: post-procedure vital signs reviewed and stable Respiratory status: spontaneous breathing, nonlabored ventilation and respiratory function stable Cardiovascular status: blood pressure returned to baseline and stable Postop Assessment: no signs of nausea or vomiting Anesthetic complications: no     Last Vitals:  Vitals:   01/02/18 1128 01/02/18 1138  BP: 121/67 139/78  Pulse: (!) 59 68  Resp: 16 18  Temp:    SpO2: 99% 100%    Last Pain:  Vitals:   01/02/18 1118  TempSrc: Tympanic                 Jasn Xia

## 2018-01-02 NOTE — H&P (Signed)
Wyline Mood, MD [address], [address], [place], [place], [address] [address], [address], [place], [place], [address] Phone: [phone]  Fax: [phone]  Primary Care Physician:  Lauro Regulus, MD   Pre-Procedure History & Physical: HPI:  Jermaine Mathenia. is a 84 y.o. male is here for an endoscopy and sigmoidoscopy   Past Medical History:  Diagnosis Date  . CHF (congestive heart failure) (HCC)   . COPD (chronic obstructive pulmonary disease) (HCC)   . Diabetes mellitus without complication (HCC)   . Hypertension   . LVH (left ventricular hypertrophy)   . OSA (obstructive sleep apnea)   . Parkinson's disease (HCC) 2008    Past Surgical History:  Procedure Laterality Date  . CORONARY ARTERY BYPASS GRAFT  2008   double   . PACEMAKER PLACEMENT N/A 2016    Prior to Admission medications   Medication Sig Start Date End Date Taking? Authorizing Provider  allopurinol (ZYLOPRIM) 300 MG tablet Take 150 mg by mouth daily.    Yes [provider]  Amantadine HCl 100 MG tablet Take 100 mg by mouth 2 (two) times daily. Take with CARBIDOPA-LEVODOPA 11/16/17  Yes [provider]  aspirin 325 MG tablet Take 325 mg by mouth at bedtime.   Yes [provider]  carbidopa-levodopa (SINEMET IR) 25-250 MG tablet Take 2-2.5 tablets by mouth 2 (two) times daily.    Yes [provider]  carvedilol (COREG) 3.125 MG tablet Take 3.125 mg by mouth 2 (two) times daily with a meal.   Yes [provider]  cholecalciferol (VITAMIN D) 1000 UNITS tablet Take 1,000 Units by mouth daily.   Yes [provider]  Melatonin 5 MG TABS Take 5 mg by mouth at bedtime.    Yes [provider]  polyethylene glycol (MIRALAX / GLYCOLAX) packet Take 17 g by mouth daily as needed for mild constipation.   Yes [provider]  potassium chloride SA (K-DUR,KLOR-CON) 20 MEQ tablet Take 20 mEq by mouth daily.   Yes [provider]    pravastatin (PRAVACHOL) 20 MG tablet Take 20 mg by mouth at bedtime.    Yes [provider]  rOPINIRole (REQUIP) 1 MG tablet Take 1 mg by mouth 2 (two) times daily.    Yes [provider]  torsemide (DEMADEX) 20 MG tablet Take 20 mg by mouth daily.   Yes [provider]    Allergies as of 12/30/2017  . (No Known Allergies)    Family History  Problem Relation Age of Onset  . Anemia Neg Hx   . Arrhythmia Neg Hx   . Asthma Neg Hx   . Clotting disorder Neg Hx   . Fainting Neg Hx   . Heart attack Neg Hx   . Heart disease Neg Hx   . Heart failure Neg Hx   . Hyperlipidemia Neg Hx   . Hypertension Neg Hx     Social History   Socioeconomic History  . Marital status: Widowed    Spouse name: Not on file  . Number of children: Not on file  . Years of education: Not on file  . Highest education level: Not on file  Social Needs  . Financial resource strain: Not on file  . Food insecurity - worry: Not on file  . Food insecurity - inability: Not on file  . Transportation needs - medical: Not on file  . Transportation needs - non-medical: Not on file  Occupational History  .  Not on file  Tobacco Use  . Smoking status: Never Smoker  . Smokeless tobacco: Never Used  Substance and Sexual Activity  . Alcohol use: No    Alcohol/week: 0.0 oz  . Drug use: No  . Sexual activity: Not on file  Other Topics Concern  . Not on file  Social History Narrative  . Not on file    Review of Systems: See HPI, otherwise negative ROS  Physical Exam: BP 133/72 (BP Location: Right Arm)   Pulse 73   Temp 98.1 F (36.7 C) (Oral)   Resp (!) 37   Ht 5\' 8"  (1.727 m)   Wt 220 lb 9.6 oz (100.1 kg)   SpO2 100%   BMI 33.54 kg/m  General:   Alert,  pleasant and cooperative in NAD Head:  Normocephalic and atraumatic. Neck:  Supple; no masses or thyromegaly. Lungs:  Clear throughout to auscultation, normal respiratory effort.    Heart:  +S1, +S2, Regular rate and  rhythm, No edema. Abdomen:  Soft, nontender and nondistended. Normal bowel sounds, without guarding, and without rebound.   Neurologic:  Alert and  oriented x4;  grossly normal neurologically.  Impression/Plan: Jermaine Ibaharles G Schlosser Sr. is here for an endoscopy and sigmoidoscopy ( did not complete the prep )    Risks, benefits, limitations, and alternatives regarding endoscopy have been reviewed with the patient.  Questions have been answered.  All parties agreeable.   Wyline MoodKiran Evelynne Spiers, MD  01/02/2018, 10:27 AM

## 2018-01-02 NOTE — Progress Notes (Signed)
[facility] [place] [facility]   PATIENT NAME: Jermaine [name]    MR#:  [id number]  DATE OF BIRTH:  15-Oct-1929  SUBJECTIVE:   Patient without acute events overnight. Plan for EGD and colonoscopy this morning. Patient has not completed bowel prep  REVIEW OF SYSTEMS:    Review of Systems  Constitutional: Negative for fever, chills weight loss HENT: Negative for ear pain, nosebleeds, congestion, facial swelling, rhinorrhea, neck pain, neck stiffness and ear discharge.   Respiratory: Negative for cough, shortness of breath, wheezing  Cardiovascular: Negative for chest pain, palpitations and leg swelling.  Gastrointestinal: Negative for heartburn, abdominal pain, vomiting, diarrhea or consitpation Positive dark-colored stools /bloody bowel movements Genitourinary: Negative for dysuria, urgency, frequency, hematuria Musculoskeletal: Negative for back pain or joint pain Neurological: Negative for dizziness, seizures, syncope, focal weakness,  numbness and headaches.  He has Parkinson's disease Hematological: Does not bruise/bleed easily.  Psychiatric/Behavioral: Confusion has improved   Tolerating Diet: Nothing by mouth     DRUG ALLERGIES:  No Known Allergies  VITALS:  Blood pressure 133/72, pulse 73, temperature 98.1 F (36.7 C), temperature source Oral, resp. rate (!) 37, height 5\' 8"  (1.727 m), weight 100.1 kg (220 lb 9.6 oz), SpO2 100 %.  PHYSICAL EXAMINATION:  Constitutional: Appears well-developed and well-nourished. No distress. HENT: Normocephalic. Marland Kitchen Oropharynx is clear and moist.  Eyes: Conjunctivae and EOM are normal. PERRLA, no scleral icterus.  Neck: Normal ROM. Neck supple. No JVD. No tracheal deviation. CVS: RRR, S1/S2 +, no murmurs, no gallops, no carotid bruit.  Pulmonary: Effort and breath sounds normal, no stridor, rhonchi, wheezes, rales.  Abdominal: Soft. BS +,  no distension, tenderness, rebound or guarding.  Musculoskeletal: Normal  range of motion. No edema and no tenderness.  Neuro: Alert. CN 2-12 grossly intact. No focal deficits. Skin: Skin is warm and dry. No rash noted. Psychiatric: Normal mood and affect.      LABORATORY PANEL:   CBC Recent Labs  Lab 01/02/18 0339  WBC 6.0  HGB 9.4*  HCT 28.7*  PLT 123*   ------------------------------------------------------------------------------------------------------------------  Chemistries  Recent Labs  Lab 12/30/17 1043  01/01/18 0611  NA 143   < > 139  K 4.0   < > 3.9  CL 106   < > 105  CO2 29   < > 26  GLUCOSE 104*   < > 91  BUN 24*   < > 21*  CREATININE 0.69   < > 0.83  CALCIUM 8.8*   < > 8.8*  AST 19  --   --   ALT <5*  --   --   ALKPHOS 64  --   --   BILITOT 1.3*  --   --    < > = values in this interval not displayed.   ------------------------------------------------------------------------------------------------------------------  Cardiac Enzymes No results for input(s): TROPONINI in the last 168 hours. ------------------------------------------------------------------------------------------------------------------  RADIOLOGY:  No results found.   ASSESSMENT AND PLAN:   84 year old male with history of diabetes and Parkinson's disease who presents with rectal bleeding.  1. Rectal bleeding/maroon stools: Patient is plan for EGD and colonoscopy today   2. Acute blood loss anemia: Hemoglobin has dropped slightly on admission however remained stable. Continue to monitor No need for blood transfusion at this point  3. History of Parkinson's disease: Continue Sinemet and amantadine 4. Chronic hypoxic respiratory failure due to COPD without signs of exacerbation: Continue oxygen  5. Diabetes: Continue sliding scale  6. Hyperlipidemia: Continue  statin 7. Hypokalemia: Resolved 8. Essential hypertension: Continue Coreg  9. Chronic systolic heart failure EF of 20%: No signs of exacerbation Continue torsemide Monitor daily  weight and intake and output  10. Restless leg syndrome: Continue Requip   Management plans discussed with the patient and he is in agreement.  CODE STATUS: Full  TOTAL TIME TAKING CARE OF THIS PATIENT: 24 minutes.   Physical therapy consultation for discharge planning  POSSIBLE D/C 1-2 days, DEPENDING ON CLINICAL CONDITION.   Jermaine [name] M.D on 01/02/2018 at 8:19 AM  Between 7am to 6pm - Pager - [phone] After 6pm go to [email or web address] - Social research officer, governmentpassword [email or web address]  [facility]  Office  [phone]  CC: Primary care physician; Lauro RegulusAnderson, Jermaine W, MD  Note: This dictation was prepared with Dragon dictation along with smaller phrase technology. Any transcriptional errors that result from this process are unintentional.

## 2018-01-02 NOTE — Care Management (Signed)
Met with patient while delivering IM. He goes by Romie Minus.  He is from independent living with [facility]. He states he still have a home here in [place] pending sale. He is a retired Chart[employer] loss adjuster. He states he "needs assisted living because he is struggling to live independently".  He states that ambulates with a walker at baseline. He is on chronic O2 through [employer] at home.  He agrees with SNF for rehab. GI scope pending. Daughter Almyra Free [phone].

## 2018-01-02 NOTE — Op Note (Signed)
[facility] Gastroenterology Patient Name: Jermaine SableCharles [name] Procedure Date: 01/02/2018 10:50 AM MRN: [id number] Account #: [id number] Date of Birth: 27-Feb-1929 Admit Type: Inpatient Age: 84 [facility] 4 Gender: Male Note Status: Finalized Procedure:            Upper GI endoscopy Indications:          Melena Providers:            Wyline MoodKiran Suann Klier MD, MD Referring MD:         Marya AmslerMarshall W. Dareen PianoAnderson MD, MD (Referring MD) Medicines:            Monitored Anesthesia Care Complications:        No immediate complications. Procedure:            Pre-Anesthesia Assessment:                       - Prior to the procedure, a History and Physical was                        performed, and patient medications, allergies and                        sensitivities were reviewed. The patient's tolerance of                        previous anesthesia was reviewed.                       - The risks and benefits of the procedure and the                        sedation options and risks were discussed with the                        patient. All questions were answered and informed                        consent was obtained.                       - ASA Grade Assessment: III - A patient with severe                        systemic disease.                       After obtaining informed consent, the endoscope was                        passed under direct vision. Throughout the procedure,                        the patient's blood pressure, pulse, and oxygen                        saturations were monitored continuously. The Endoscope                        was introduced through the mouth, and advanced to the  third part of duodenum. The upper GI endoscopy was                        accomplished with ease. The patient tolerated the                        procedure well. Findings:      The esophagus was normal.      Few non-bleeding superficial gastric ulcers with no  stigmata of bleeding       were found at the incisura, in the gastric antrum and in the prepyloric       region of the stomach. The largest lesion was 6 mm in largest dimension.       Biopsies were taken with a cold forceps for histology.      Diffuse severe inflammation characterized by congestion (edema),       erosions and erythema was found in the gastric body, at the incisura, in       the gastric antrum and in the prepyloric region of the stomach. Biopsies       were taken with a cold forceps for histology.      Two non-bleeding superficial duodenal ulcers with no stigmata of       bleeding were found in the duodenal bulb. The largest lesion was 3 mm in       largest dimension.      The cardia and gastric fundus were normal on retroflexion. Impression:           - Normal esophagus.                       - Non-bleeding gastric ulcers with no stigmata of                        bleeding. Biopsied.                       - Gastritis. Biopsied.                       - Multiple non-bleeding duodenal ulcers with no                        stigmata of bleeding. Recommendation:       - Await pathology results.                       - 1. No NSAID's                       2. Monitor CBC closely                       3. Omeprazole 40 mg once daily                       4. Stool H pylori antigen                       5. F/u path results                       6. Repeat EGD in 8 weeks to check for healing of  multiple gastric and duodenal ulcers - appearance                        suggestive of NSAID use.                       7. I planned for a flex sigmoidoscopy now as he didnt                        complete the prep , we just noted that he was passing                        large qty of dark brown stool. Will plan for                        colonoscopy tomorrow. Commence on bowel prep around 3                        pm today , can stay on clears till then . Procedure  Code(s):    --- Professional ---                       [phone], Esophagogastroduodenoscopy, flexible, transoral;                        with biopsy, single or multiple Diagnosis Code(s):    --- Professional ---                       K25.9, Gastric ulcer, unspecified as acute or chronic,                        without hemorrhage or perforation                       K29.70, Gastritis, unspecified, without bleeding                       K26.9, Duodenal ulcer, unspecified as acute or chronic,                        without hemorrhage or perforation                       K92.1, Melena (includes Hematochezia) CPT copyright 2016 American Medical Association. All rights reserved. The codes documented in this report are preliminary and upon coder review may  be revised to meet current compliance requirements. Wyline Mood, MD Wyline Mood MD, MD 01/02/2018 11:14:40 AM This report has been signed electronically. Number of Addenda: 0 Note Initiated On: 01/02/2018 10:50 AM      [facility]

## 2018-01-02 NOTE — [place] FL2 (Signed)
[place] MEDICAID FL2 LEVEL OF CARE SCREENING TOOL     IDENTIFICATION  Patient Name: Jermaine IbaCharles G Serafin Sr. Birthdate: 07/30/1929 Sex: male Admission Date (Current Location): 12/30/2017  [place]   Facility and Address:  [facility], [address], [place], [place] [address]      Provider Number: [id number]  Attending Physician Name and Address:  Adrian SaranMody, Sital, MD  Relative Name and Phone Number:       Current Level of Care: Hospital Recommended Level of Care: Skilled Nursing Facility Prior Approval Number:    Date Approved/Denied:   PASRR Number: ([id number] )  Discharge Plan: SNF    Current Diagnoses: Patient Active Problem List   Diagnosis Date Noted  . Lower GI bleed 12/30/2017  . Muscle cramping 11/17/2016  . Chronic systolic heart failure (HCC) 12/25/2015  . Essential hypertension 12/25/2015  . Obstructive sleep apnea 12/25/2015  . Depression 12/25/2015  . Parkinson disease (HCC) 12/25/2015    Orientation RESPIRATION BLADDER Height & Weight     Self, Time, Situation, Place  O2(2 Liters Oxygen. ) Continent Weight: 220 lb 9.6 oz (100.1 kg) Height:  5\' 8"  (172.7 cm)  BEHAVIORAL SYMPTOMS/MOOD NEUROLOGICAL BOWEL NUTRITION STATUS      Continent Diet(Diet: Clear Liquid to be advanced. )  AMBULATORY STATUS COMMUNICATION OF NEEDS Skin   Extensive Assist Verbally Normal                       Personal Care Assistance Level of Assistance  Bathing, Feeding, Dressing Bathing Assistance: Limited assistance Feeding assistance: Independent Dressing Assistance: Limited assistance     Functional Limitations Info  Sight, Hearing, Speech Sight Info: Adequate Hearing Info: Adequate Speech Info: Adequate    SPECIAL CARE FACTORS FREQUENCY  PT (By licensed PT), OT (By licensed OT)     PT Frequency: (5) OT Frequency: (5)            Contractures      Additional Factors Info  Code Status, Allergies  Code Status Info: (Full Code. ) Allergies Info: (No Known Allergies. )           Current Medications (01/02/2018):  This is the current hospital active medication list Current Facility-Administered Medications  Medication Dose Route Frequency Provider Last Rate Last Dose  . 0.9 %  sodium chloride infusion  250 mL Intravenous PRN Sudini, Wardell HeathSrikar, MD      . 0.9 %  sodium chloride infusion   Intravenous Continuous Wyline MoodAnna, Kiran, MD      . acetaminophen (TYLENOL) tablet 650 mg  650 mg Oral Q6H PRN Milagros LollSudini, Srikar, MD       Or  . acetaminophen (TYLENOL) suppository 650 mg  650 mg Rectal Q6H PRN Sudini, Wardell HeathSrikar, MD      . albuterol (PROVENTIL) (2.5 MG/3ML) 0.083% nebulizer solution 2.5 mg  2.5 mg Nebulization Q2H PRN Milagros LollSudini, Srikar, MD   2.5 mg at 01/01/18 0247  . allopurinol (ZYLOPRIM) tablet 150 mg  150 mg Oral Daily Milagros LollSudini, Srikar, MD   150 mg at 01/02/18 1247  . amantadine (SYMMETREL) capsule 100 mg  100 mg Oral BID Milagros LollSudini, Srikar, MD   100 mg at 01/02/18 1249  . carbidopa-levodopa (SINEMET IR) 25-250 MG per tablet immediate release 2-2.5 tablet  2-2.5 tablet Oral BID Milagros LollSudini, Srikar, MD   2 tablet at 01/02/18 1247  . carvedilol (COREG) tablet 3.125 mg  3.125 mg Oral BID WC Milagros LollSudini, Srikar, MD   Stopped at 01/02/18  1610  . insulin aspart (novoLOG) injection 0-9 Units  0-9 Units Subcutaneous TID WC Sudini, Srikar, MD      . ondansetron (ZOFRAN) tablet 4 mg  4 mg Oral Q6H PRN Milagros Loll, MD       Or  . ondansetron (ZOFRAN) injection 4 mg  4 mg Intravenous Q6H PRN Sudini, Srikar, MD      . polyethylene glycol (MIRALAX / GLYCOLAX) packet 17 g  17 g Oral Daily PRN Sudini, Srikar, MD      . polyethylene glycol powder (GLYCOLAX/MIRALAX) container 255 g  1 Container Oral Once Midge Minium, MD      . polyethylene glycol-electrolytes (NuLYTELY/GoLYTELY) solution 4,000 mL  4,000 mL Oral Once Wyline Mood, MD      . potassium chloride SA (K-DUR,KLOR-CON) CR tablet 20 mEq  20 mEq Oral Daily Milagros Loll, MD    20 mEq at 01/02/18 1245  . pravastatin (PRAVACHOL) tablet 20 mg  20 mg Oral QHS Milagros Loll, MD   20 mg at 01/01/18 2132  . rOPINIRole (REQUIP) tablet 1 mg  1 mg Oral BID Milagros Loll, MD   1 mg at 01/02/18 1246  . sodium chloride flush (NS) 0.9 % injection 3 mL  3 mL Intravenous Q12H Milagros Loll, MD   3 mL at 01/01/18 2133  . sodium chloride flush (NS) 0.9 % injection 3 mL  3 mL Intravenous PRN Sudini, Wardell Heath, MD      . torsemide (DEMADEX) tablet 20 mg  20 mg Oral Daily Milagros Loll, MD   20 mg at 01/02/18 1246     Discharge Medications: Please see discharge summary for a list of discharge medications.  Relevant Imaging Results:  Relevant Lab Results:   Additional Information (SSN: [id number])  Jermaine [name], Darleen Crocker, LCSW

## 2018-01-02 NOTE — Clinical Social Work Note (Signed)
Clinical Social Work Assessment  Patient Details  Name: Jermaine BUCKLE Sr. MRN: [id number] Date of Birth: Sep 15, 1929  Date of referral:  01/02/18               Reason for consult:  Facility Placement                Permission sought to share information with:  Chartered certified accountant granted to share information::  Yes, Verbal Permission Granted  Name::      [place]::   [place]   Relationship::     Contact Information:     Housing/Transportation Living arrangements for the past 2 months:  [place] of Information:  Patient Patient Interpreter Needed:  None Criminal Activity/Legal Involvement Pertinent to Current Situation/Hospitalization:  No - Comment as needed Significant Relationships:    Lives with:  Adult Children Do you feel safe going back to the place where you live?  Yes Need for family participation in patient care:  Yes (Comment)  Care giving concerns:  Patient lives at [place] connected to [facility].    Social Worker assessment / plan:  Holiday representative (CSW) reviewed chart and noted that PT is recommending SNF. CSW met with patient alone at bedside to discuss D/C plan. Patient was alert and oriented X4 and was sitting up in the chair at bedside. CSW introduced self and explained role of CSW department. Patient reported that he lives alone at [facility] independent living and wants to go to short term rehab at a SNF. CSW explained that [facility] will have to approve SNF and that [facility] is not in network with [employer]. Patient verbalized his understanding and is agreeable to SNF search in [facility] and prefers [facility] FL2 complete and faxed out. CSW started [facility] SNF authorization via [place] [facility]. CSW will continue to follow and assist as needed.   CSW presented bed offers to patient and he chose [facility]. Patient is okay  with a semi-private room at [facility]. [facility] admissions coordinator at [facility] is aware of accepted bed offer.   Employment status:  Retired Nurse, adult PT Recommendations:  [place] / Referral to community resources:  [place]  Patient/Family's Response to care:  Patient is agreeable to D/C [employer].   Patient/Family's Understanding of and Emotional Response to Diagnosis, Current Treatment, and Prognosis:  Patient was very pleasant and thanked CSW for assistance.   Emotional Assessment Appearance:  Appears stated age Attitude/Demeanor/Rapport:    Affect (typically observed):  Accepting, Adaptable, Pleasant Orientation:  Oriented to Self, Oriented to Place, Oriented to  Time, Oriented to Situation Alcohol / Substance use:  Not Applicable Psych involvement (Current and /or in the community):  No (Comment)  Discharge Needs  Concerns to be addressed:  Discharge Planning Concerns Readmission within the last 30 days:  No Current discharge risk:  Dependent with Mobility Barriers to Discharge:  Continued Medical Work up   [employer], Veronia Beets, LCSW 01/02/2018, 4:03 PM

## 2018-01-02 NOTE — Care Management Important Message (Signed)
Important Message  Patient Details  Name: Jermaine IbaCharles G Groseclose Sr. MRN: [id number] Date of Birth: [birth year]   Medicare Important Message Given:  Yes    Collie Siadngela Hinley Brimage, RN 01/02/2018, 9:29 AM

## 2018-01-03 ENCOUNTER — Encounter
Admission: EM | Disposition: A | Discharge status: Skilled Nursing Facility | Payer: Self-pay | Source: Home / Self Care | Attending: Internal Medicine

## 2018-01-03 ENCOUNTER — Encounter: Payer: Self-pay | Admitting: *Deleted

## 2018-01-03 ENCOUNTER — Inpatient Hospital Stay: Payer: Medicare Other | Admitting: Certified Registered"

## 2018-01-03 DIAGNOSIS — K5730 Diverticulosis of large intestine without perforation or abscess without bleeding: Secondary | ICD-10-CM

## 2018-01-03 HISTORY — PX: COLONOSCOPY WITH PROPOFOL: SHX5780

## 2018-01-03 LAB — BASIC METABOLIC PANEL
Anion gap: 12 (ref 5–15)
BUN: 16 mg/dL (ref 6–20)
CHLORIDE: 102 mmol/L (ref 101–111)
CO2: 31 mmol/L (ref 22–32)
Calcium: 8.5 mg/dL — ABNORMAL LOW (ref 8.9–10.3)
Creatinine, Ser: 0.78 mg/dL (ref 0.61–1.24)
GFR calc non Af Amer: >60 mL/min (ref >60)
Glucose, Bld: 90 mg/dL (ref 65–99)
POTASSIUM: 2.9 mmol/L — AB (ref 3.5–5.1)
SODIUM: 145 mmol/L (ref 135–145)

## 2018-01-03 LAB — CBC
HEMATOCRIT: 29.2 % — AB (ref 40.0–52.0)
HEMOGLOBIN: 9.6 g/dL — AB (ref 13.0–18.0)
MCH: 29.4 pg (ref 26.0–34.0)
MCHC: 32.9 g/dL (ref 32.0–36.0)
MCV: 89.5 fL (ref 80.0–100.0)
Platelets: 123 10*3/uL — ABNORMAL LOW (ref 150–440)
RBC: 3.26 MIL/uL — ABNORMAL LOW (ref 4.40–5.90)
RDW: 15.9 % — ABNORMAL HIGH (ref 11.5–14.5)
WBC: 6.3 10*3/uL (ref 3.8–10.6)

## 2018-01-03 LAB — MAGNESIUM: Magnesium: 2 mg/dL (ref 1.7–2.4)

## 2018-01-03 LAB — GLUCOSE, CAPILLARY
Glucose-Capillary: 85 mg/dL (ref 65–99)
Glucose-Capillary: 104 mg/dL — ABNORMAL HIGH (ref 65–99)

## 2018-01-03 LAB — SURGICAL PATHOLOGY

## 2018-01-03 SURGERY — COLONOSCOPY WITH PROPOFOL
Anesthesia: General

## 2018-01-03 MED ORDER — POTASSIUM CHLORIDE CRYS ER 20 MEQ PO TBCR
40.0000 meq | EXTENDED_RELEASE_TABLET | Freq: Once | ORAL | Status: AC
  Administered 2018-01-03: 40 meq via ORAL
  Filled 2018-01-03: qty 2

## 2018-01-03 MED ORDER — LIDOCAINE HCL (CARDIAC) 20 MG/ML IV SOLN
INTRAVENOUS | Status: DC | PRN
  Administered 2018-01-03: 50 mg via INTRAVENOUS

## 2018-01-03 MED ORDER — PROPOFOL 500 MG/50ML IV EMUL
INTRAVENOUS | Status: DC | PRN
  Administered 2018-01-03: 125 ug/kg/min via INTRAVENOUS

## 2018-01-03 MED ORDER — PROPOFOL 10 MG/ML IV BOLUS
INTRAVENOUS | Status: AC
  Filled 2018-01-03: qty 20

## 2018-01-03 MED ORDER — PROPOFOL 10 MG/ML IV BOLUS
INTRAVENOUS | Status: DC | PRN
  Administered 2018-01-03: 60 mg via INTRAVENOUS

## 2018-01-03 MED ORDER — PANTOPRAZOLE SODIUM 40 MG PO TBEC: 40.0000 mg | DELAYED_RELEASE_TABLET | Freq: Two times a day (BID) | ORAL | 0 refills | Status: DC

## 2018-01-03 MED ORDER — SODIUM CHLORIDE 0.9 % IV SOLN
INTRAVENOUS | Status: DC | PRN
  Administered 2018-01-03: 07:00:00 via INTRAVENOUS

## 2018-01-03 NOTE — Discharge Summary (Signed)
[facility] [place] [facility]   PATIENT NAME: Jermaine [name]    MR#:  [id number]  DATE OF BIRTH:  24-May-1929  DATE OF ADMISSION:  12/30/2017 ADMITTING PHYSICIAN: Milagros LollSrikar Sudini, MD  DATE OF DISCHARGE: 01/02/2018  PRIMARY CARE PHYSICIAN: Lauro RegulusAnderson, [name], MD    ADMISSION DIAGNOSIS:  Lower GI bleed [K92.2]  DISCHARGE DIAGNOSIS:  Active Problems:   Lower GI bleed   SECONDARY DIAGNOSIS:   Past Medical History:  Diagnosis Date  . CHF (congestive heart failure) (HCC)   . COPD (chronic obstructive pulmonary disease) (HCC)   . Diabetes mellitus without complication (HCC)   . Hypertension   . LVH (left ventricular hypertrophy)   . OSA (obstructive sleep apnea)   . Parkinson's disease [facility](HCC) 2008    HOSPITAL COURSE:    84 year old male with history of diabetes and Parkinson's disease who presents with rectal bleeding.  1. Rectal bleeding/maroon stools:  EGD shows nonbleeding gastric ulcers Bleeding with gastritis and multiple nonbleeding duodenal ulcers Colonoscopy showed no source of bleeding Follow up on stool H. pylori antigen Repeat EGD in 8 weeks to check for healing of multiple gastric and duodenal ulcers Continue PPI BID  NO NSAIDS?AS for now  2. Acute blood loss anemia: Hemoglobin has remained stable and patient has not needed blood transfusion 3. History of Parkinson's disease: Continue Sinemet and amantadine 4. Chronic hypoxic respiratory failure due to COPD without signs of exacerbation: Continue oxygen  5. Diabetes: Continue sliding scale  6. Hyperlipidemia: Continue statin 7. Hypokalemia: I will check magnesium level and replete potassium 8. Essential hypertension: Continue Coreg  9. Chronic systolic heart failure EF of 20%: No signs of exacerbation Continue torsemide Monitor daily weight and intake and output  10. Restless leg syndrome: Continue Requip     DISCHARGE CONDITIONS AND DIET:   Stable Heart  healthy  CONSULTS OBTAINED:  Treatment Team:  Pasty Spillersahiliani, Varnita B, MD  DRUG ALLERGIES:  No Known Allergies  DISCHARGE MEDICATIONS:   Allergies as of 01/03/2018   No Known Allergies     Medication List    STOP taking these medications   aspirin 325 MG tablet     TAKE these medications   allopurinol 300 MG tablet Commonly known as:  ZYLOPRIM Take 150 mg by mouth daily.   Amantadine HCl 100 MG tablet Take 100 mg by mouth 2 (two) times daily. Take with CARBIDOPA-LEVODOPA   carbidopa-levodopa 25-250 MG tablet Commonly known as:  SINEMET IR Take 2-2.5 tablets by mouth 2 (two) times daily.   carvedilol 3.125 MG tablet Commonly known as:  COREG Take 3.125 mg by mouth 2 (two) times daily with a meal.   cholecalciferol 1000 units tablet Commonly known as:  VITAMIN D Take 1,000 Units by mouth daily.   Melatonin 5 MG Tabs Take 5 mg by mouth at bedtime.   pantoprazole 40 MG tablet Commonly known as:  PROTONIX Take 1 tablet (40 mg total) by mouth 2 (two) times daily.   polyethylene glycol packet Commonly known as:  MIRALAX / GLYCOLAX Take 17 g by mouth daily as needed for mild constipation.   potassium chloride SA 20 MEQ tablet Commonly known as:  K-DUR,KLOR-CON Take 20 mEq by mouth daily.   pravastatin 20 MG tablet Commonly known as:  PRAVACHOL Take 20 mg by mouth at bedtime.   rOPINIRole 1 MG tablet Commonly known as:  REQUIP Take 1 mg by mouth 2 (two) times daily.   torsemide 20 MG tablet Commonly known  as:  DEMADEX Take 20 mg by mouth daily.         Today   CHIEF COMPLAINT:  No issues doing well family at bedside   VITAL SIGNS:  Blood pressure (!) 144/118, pulse 67, temperature 97.8 F (36.6 C), temperature source Oral, resp. rate 15, height 5\' 8"  (1.727 m), weight 98.4 kg (217 lb), SpO2 100 %.   REVIEW OF SYSTEMS:  Review of Systems  Constitutional: Negative.  Negative for chills, fever and malaise/fatigue.  HENT: Negative.  Negative for  ear discharge, ear pain, hearing loss, nosebleeds and sore throat.   Eyes: Negative.  Negative for blurred vision and pain.  Respiratory: Negative.  Negative for cough, hemoptysis, shortness of breath and wheezing.   Cardiovascular: Negative.  Negative for chest pain, palpitations and leg swelling.  Gastrointestinal: Negative.  Negative for abdominal pain, blood in stool, diarrhea, nausea and vomiting.  Genitourinary: Negative.  Negative for dysuria.  Musculoskeletal: Negative.  Negative for back pain.  Skin: Negative.   Neurological: Negative for dizziness, tremors, speech change, focal weakness, seizures and headaches.  Endo/Heme/Allergies: Negative.  Does not bruise/bleed easily.  Psychiatric/Behavioral: Negative.  Negative for depression, hallucinations and suicidal ideas.     PHYSICAL EXAMINATION:  GENERAL:  84 y.o.-year-old patient lying in the bed with no acute distress.  NECK:  Supple, no jugular venous distention. No thyroid enlargement, no tenderness.  LUNGS: Normal breath sounds bilaterally, no wheezing, rales,rhonchi  No use of accessory muscles of respiration.  CARDIOVASCULAR: S1, S2 normal. No murmurs, rubs, or gallops.  ABDOMEN: Soft, non-tender, non-distended. Bowel sounds present. No organomegaly or mass.  EXTREMITIES: No pedal edema, cyanosis, or clubbing.  PSYCHIATRIC: The patient is alert and oriented x 3.  SKIN: No obvious rash, lesion, or ulcer.   DATA REVIEW:   CBC Recent Labs  Lab 01/03/18 0330  WBC 6.3  HGB 9.6*  HCT 29.2*  PLT 123*    Chemistries  Recent Labs  Lab 12/30/17 1043  01/03/18 0330  NA 143   < > 145  K 4.0   < > 2.9*  CL 106   < > 102  CO2 29   < > 31  GLUCOSE 104*   < > 90  BUN 24*   < > 16  CREATININE 0.69   < > 0.78  CALCIUM 8.8*   < > 8.5*  MG  --   --  2.0  AST 19  --   --   ALT <5*  --   --   ALKPHOS 64  --   --   BILITOT 1.3*  --   --    < > = values in this interval not displayed.    Cardiac Enzymes No results for  input(s): TROPONINI in the last 168 hours.  Microbiology Results  @MICRORSLT48 @  RADIOLOGY:  No results found.    Allergies as of 01/03/2018   No Known Allergies     Medication List    STOP taking these medications   aspirin 325 MG tablet     TAKE these medications   allopurinol 300 MG tablet Commonly known as:  ZYLOPRIM Take 150 mg by mouth daily.   Amantadine HCl 100 MG tablet Take 100 mg by mouth 2 (two) times daily. Take with CARBIDOPA-LEVODOPA   carbidopa-levodopa 25-250 MG tablet Commonly known as:  SINEMET IR Take 2-2.5 tablets by mouth 2 (two) times daily.   carvedilol 3.125 MG tablet Commonly known as:  COREG Take 3.125 mg by mouth 2 (  two) times daily with a meal.   cholecalciferol 1000 units tablet Commonly known as:  VITAMIN D Take 1,000 Units by mouth daily.   Melatonin 5 MG Tabs Take 5 mg by mouth at bedtime.   pantoprazole 40 MG tablet Commonly known as:  PROTONIX Take 1 tablet (40 mg total) by mouth 2 (two) times daily.   polyethylene glycol packet Commonly known as:  MIRALAX / GLYCOLAX Take 17 g by mouth daily as needed for mild constipation.   potassium chloride SA 20 MEQ tablet Commonly known as:  K-DUR,KLOR-CON Take 20 mEq by mouth daily.   pravastatin 20 MG tablet Commonly known as:  PRAVACHOL Take 20 mg by mouth at bedtime.   rOPINIRole 1 MG tablet Commonly known as:  REQUIP Take 1 mg by mouth 2 (two) times daily.   torsemide 20 MG tablet Commonly known as:  DEMADEX Take 20 mg by mouth daily.          Management plans discussed with the patient and he is in agreement. Stable for discharge snf  Patient should follow up with pcp  CODE STATUS:     Code Status Orders  (From admission, onward)        Start     Ordered   12/30/17 1223  Full code  Continuous     12/30/17 1223    Code Status History    Date Active Date Inactive Code Status Order ID Comments User Context   03/11/2016 17:15 03/14/2016 16:15 Full Code  [id number]  Ramonita Lab, MD Inpatient    Advance Directive Documentation     Most Recent Value  Type of Advance Directive  Healthcare Power of Attorney, Living will  Pre-existing out of facility DNR order (yellow form or pink MOST form)  No data  "MOST" Form in Place?  No data      TOTAL TIME TAKING CARE OF THIS PATIENT: 38 minutes.    Note: This dictation was prepared with Dragon dictation along with smaller phrase technology. Any transcriptional errors that result from this process are unintentional.  Blayke Cordrey M.D on 01/03/2018 at 12:32 PM  Between 7am to 6pm - Pager - [phone] After 6pm go to [email or web address] - Social research officer, government  [facility]  Office  [phone]  CC: Primary care physician; Lauro Regulus, MD

## 2018-01-03 NOTE — Anesthesia Preprocedure Evaluation (Signed)
Anesthesia Evaluation  Patient identified by MRN, date of birth, ID band Patient awake    Reviewed: Allergy & Precautions, NPO status , Patient's Chart, lab work & pertinent test results  History of Anesthesia Complications Negative for: history of anesthetic complications  Airway Mallampati: II  TM Distance: >3 FB Neck ROM: Full    Dental  (+) Missing, Poor Dentition, Implants   Pulmonary sleep apnea , COPD,  oxygen dependent,    breath sounds clear to auscultation- rhonchi (-) wheezing      Cardiovascular hypertension, Pt. on medications + CAD, + CABG (2008) and +CHF (EF 20%)   Rhythm:Regular Rate:Normal - Systolic murmurs and - Diastolic murmurs Echo 02/10/17: SEVERE LV SYSTOLIC DYSFUNCTION (EF 20%) WITH MILD LVH NORMAL RIGHT VENTRICULAR SYSTOLIC FUNCTION SEVERE VALVULAR REGURGITATION (severe MR) NO VALVULAR STENOSIS   Neuro/Psych PSYCHIATRIC DISORDERS Depression Parkinson's disease     GI/Hepatic negative GI ROS, Neg liver ROS,   Endo/Other  diabetes, Oral Hypoglycemic Agents  Renal/GU      Musculoskeletal negative musculoskeletal ROS (+)   Abdominal (+) + obese,   Peds  Hematology negative hematology ROS (+)   Anesthesia Other Findings Past Medical History: No date: CHF (congestive heart failure) (HCC) No date: COPD (chronic obstructive pulmonary disease) (HCC) No date: Diabetes mellitus without complication (HCC) No date: Hypertension No date: LVH (left ventricular hypertrophy) No date: OSA (obstructive sleep apnea) 2008: Parkinson's disease (HCC)   Reproductive/Obstetrics                             Anesthesia Physical  Anesthesia Plan  ASA: III  Anesthesia Plan: General   Post-op Pain Management:    Induction: Intravenous  PONV Risk Score and Plan: 1 and Propofol infusion  Airway Management Planned: Natural Airway  Additional Equipment:   Intra-op Plan:    Post-operative Plan:   Informed Consent: I have reviewed the patients History and Physical, chart, labs and discussed the procedure including the risks, benefits and alternatives for the proposed anesthesia with the patient or authorized representative who has indicated his/her understanding and acceptance.   Dental advisory given  Plan Discussed with: CRNA and Anesthesiologist  Anesthesia Plan Comments:         Anesthesia Quick Evaluation

## 2018-01-03 NOTE — Clinical Social Work Placement (Signed)
   CLINICAL SOCIAL WORK PLACEMENT  NOTE  Date:  01/03/2018  Patient Details  Name: Jermaine IbaCharles G Loma Sr. MRN: [id number] Date of Birth: 18-Mar-1929  Clinical Social Work is seeking post-discharge placement for this patient at the Skilled  Nursing Facility level of care (*CSW will initial, date and re-position this form in  chart as items are completed):  Yes   Patient/family provided with [facility] Clinical Social Work Department's list of facilities offering this level of care within the geographic area requested by the patient (or if unable, by the patient's family).  Yes   Patient/family informed of their freedom to choose among providers that offer the needed level of care, that participate in Medicare, Medicaid or managed care program needed by the patient, have an available bed and are willing to accept the patient.  Yes   Patient/family informed of [name] ownership interest in [facility] and [facility], as well as of the fact that they are under no obligation to receive care at these facilities.  PASRR submitted to EDS on       PASRR number received on       Existing PASRR number confirmed on 01/02/18     FL2 transmitted to all facilities in geographic area requested by pt/family on 01/02/18     FL2 transmitted to all facilities within larger geographic area on       Patient informed that his/her managed care company has contracts with or will negotiate with certain facilities, including the following:        Yes   Patient/family informed of bed offers received.  Patient chooses bed at [facility] )     Physician recommends and patient chooses bed at      Patient to be transferred to [facility] ) on 01/03/18.  [facility]     Patient family notified on 01/03/18 of transfer.  Name of family member notified:  (Patient's daughter Raynelle FanningJulie is at bedside and aware of D/C today. )     PHYSICIAN    Additional Comment:    _______________________________________________ Jermaine [name], Jermaine CrockerBailey M, LCSW 01/03/2018, 1:34 PM

## 2018-01-03 NOTE — Anesthesia Post-op Follow-up Note (Signed)
Anesthesia QCDR form completed.        

## 2018-01-03 NOTE — Progress Notes (Signed)
Discharge instructions reviewed with patient and patient's daughter. All questions answered. [facility] Patient to be moved there to room 209A. Currently waiting on arrival of EMS to arrange transport, currently two patients ahead of Jermaine [name] according to dispatcher. EMS unable to give time frame for transfer.

## 2018-01-03 NOTE — H&P (Signed)
Jermaine MoodKiran Afsheen Antony, MD [address], [address], [place], [place], [address] [address], [address], [place], [place], [address] Phone: [phone]  Fax: [phone]  Primary Care Physician:  Jermaine RegulusAnderson, [name], MD   Pre-Procedure History & Physical: HPI:  Jermaine ColaceCharles G Smet Sr. is a 84 y.o. male is here for an colonoscopy.   Past Medical History:  Diagnosis Date  . CHF (congestive heart failure) (HCC)   . COPD (chronic obstructive pulmonary disease) (HCC)   . Diabetes mellitus without complication (HCC)   . Hypertension   . LVH (left ventricular hypertrophy)   . OSA (obstructive sleep apnea)   . Parkinson's disease (HCC) 2008    Past Surgical History:  Procedure Laterality Date  . CORONARY ARTERY BYPASS GRAFT  2008   double   . PACEMAKER PLACEMENT N/A 2016    Prior to Admission medications   Medication Sig Start Date End Date Taking? Authorizing Provider  allopurinol (ZYLOPRIM) 300 MG tablet Take 150 mg by mouth daily.    Yes [provider]  Amantadine HCl 100 MG tablet Take 100 mg by mouth 2 (two) times daily. Take with CARBIDOPA-LEVODOPA 11/16/17  Yes [provider]  aspirin 325 MG tablet Take 325 mg by mouth at bedtime.   Yes [provider]  carbidopa-levodopa (SINEMET IR) 25-250 MG tablet Take 2-2.5 tablets by mouth 2 (two) times daily.    Yes [provider]  carvedilol (COREG) 3.125 MG tablet Take 3.125 mg by mouth 2 (two) times daily with a meal.   Yes [provider]  cholecalciferol (VITAMIN D) 1000 UNITS tablet Take 1,000 Units by mouth daily.   Yes [provider]  Melatonin 5 MG TABS Take 5 mg by mouth at bedtime.    Yes [provider]  polyethylene glycol (MIRALAX / GLYCOLAX) packet Take 17 g by mouth daily as needed for mild constipation.   Yes [provider]  potassium chloride SA (K-DUR,KLOR-CON) 20 MEQ tablet Take 20 mEq by mouth daily.   Yes [provider]  pravastatin  (PRAVACHOL) 20 MG tablet Take 20 mg by mouth at bedtime.    Yes [provider]  rOPINIRole (REQUIP) 1 MG tablet Take 1 mg by mouth 2 (two) times daily.    Yes [provider]  torsemide (DEMADEX) 20 MG tablet Take 20 mg by mouth daily.   Yes [provider]    Allergies as of 12/30/2017  . (No Known Allergies)    Family History  Problem Relation Age of Onset  . Anemia Neg Hx   . Arrhythmia Neg Hx   . Asthma Neg Hx   . Clotting disorder Neg Hx   . Fainting Neg Hx   . Heart attack Neg Hx   . Heart disease Neg Hx   . Heart failure Neg Hx   . Hyperlipidemia Neg Hx   . Hypertension Neg Hx     Social History   Socioeconomic History  . Marital status: Widowed    Spouse name: Not on file  . Number of children: Not on file  . Years of education: Not on file  . Highest education level: Not on file  Social Needs  . Financial resource strain: Not on file  . Food insecurity - worry: Not on file  . Food insecurity - inability: Not on file  . Transportation needs - medical: Not on file  . Transportation needs - non-medical: Not on file  Occupational History  . Not on  file  Tobacco Use  . Smoking status: Never Smoker  . Smokeless tobacco: Never Used  Substance and Sexual Activity  . Alcohol use: No    Alcohol/week: 0.0 oz  . Drug use: No  . Sexual activity: Not on file  Other Topics Concern  . Not on file  Social History Narrative  . Not on file    Review of Systems: See HPI, otherwise negative ROS  Physical Exam: BP (!) 144/70   Pulse 66   Temp (!) 96 F (35.6 C) (Tympanic)   Resp 20   Ht 5\' 8"  (1.727 m)   Wt 217 lb (98.4 kg)   SpO2 100%   BMI 32.99 kg/m  General:   Alert,  pleasant and cooperative in NAD Head:  Normocephalic and atraumatic. Neck:  Supple; no masses or thyromegaly. Lungs:  Clear throughout to auscultation, normal respiratory effort.    Heart:  +S1, +S2, Regular rate and rhythm, No edema. Abdomen:  Soft, nontender  and nondistended. Normal bowel sounds, without guarding, and without rebound.   Neurologic:  Alert and  oriented x4;  grossly normal neurologically.  Impression/Plan: Jermaine [name] Sr. is here for an colonoscopy to be performed for melena.  Risks, benefits, limitations, and alternatives regarding  colonoscopy have been reviewed with the patient.  Questions have been answered.  All parties agreeable.   Jermaine Mood, MD  01/03/2018, 7:31 AM

## 2018-01-03 NOTE — Transfer of Care (Signed)
Immediate Anesthesia Transfer of Care Note  Patient: Jermaine [name].  Procedure(s) Performed: COLONOSCOPY WITH PROPOFOL (N/A )  Patient Location: Endoscopy Unit  Anesthesia Type:General  Level of Consciousness: sedated  Airway & Oxygen Therapy: Patient Spontanous Breathing and Patient connected to nasal cannula oxygen  Post-op Assessment: Report given to RN and Post -op Vital signs reviewed and stable  Post vital signs: Reviewed and stable  Last Vitals:  Vitals:   01/03/18 0500 01/03/18 0723  BP:  (!) 144/70  Pulse:  66  Resp:  20  Temp: 36.6 C (!) 35.6 C  SpO2:  100%    Last Pain:  Vitals:   01/03/18 0723  TempSrc: Tympanic         Complications: No apparent anesthesia complications

## 2018-01-03 NOTE — Op Note (Signed)
[facility] Gastroenterology Patient Name: Kate SableCharles Mcginn Procedure Date: 01/03/2018 7:31 AM MRN: [id number] Account #: [id number] Date of Birth: 11/28/1929 Admit Type: Inpatient Age: 84 [facility] 1 Gender: Male Note Status: Finalized Procedure:            Colonoscopy Indications:          Melena Providers:            Wyline MoodKiran Shantal Roan MD, MD Referring MD:         Marya AmslerMarshall W. Dareen PianoAnderson MD, MD (Referring MD) Medicines:            Monitored Anesthesia Care Complications:        No immediate complications. Procedure:            Pre-Anesthesia Assessment:                       - ASA Grade Assessment: III - A patient with severe                        systemic disease.                       After obtaining informed consent, the colonoscope was                        passed under direct vision. Throughout the procedure,                        the patient's blood pressure, pulse, and oxygen                        saturations were monitored continuously. The                        Colonoscope was introduced through the anus and                        advanced to the the cecum, identified by the                        appendiceal orifice, IC valve and transillumination.                        The colonoscopy was performed with ease. The patient                        tolerated the procedure well. The quality of the bowel                        preparation was adequate. Findings:      The perianal and digital rectal examinations were normal.      Multiple small-mouthed diverticula were found in the sigmoid colon.      The exam was otherwise without abnormality on direct and retroflexion       views. Impression:           - Diverticulosis in the sigmoid colon.                       - The examination was otherwise normal on direct and  retroflexion views.                       - No specimens collected. Recommendation:       - Return patient to  hospital ward for ongoing care.                       - Advance diet as tolerated.                       - Continue present medications.                       - 1. No evidence of blood in the colon , the bleeding                        has stopped . NO NSAID's                       2. F/u H pylori stool antigen                       3. Follow up in GI clinic as outpatient                       4. I will sign out please call with questions Procedure Code(s):    --- Professional ---                       [phone], Colonoscopy, flexible; diagnostic, including                        collection of specimen(s) by brushing or washing, when                        performed (separate procedure) Diagnosis Code(s):    --- Professional ---                       K92.1, Melena (includes Hematochezia)                       K57.30, Diverticulosis of large intestine without                        perforation or abscess without bleeding CPT copyright 2016 American Medical Association. All rights reserved. The codes documented in this report are preliminary and upon coder review may  be revised to meet current compliance requirements. Wyline Mood, MD Wyline Mood MD, MD 01/03/2018 7:59:08 AM This report has been signed electronically. Number of Addenda: 0 Note Initiated On: 01/03/2018 7:31 AM Scope Withdrawal Time: 0 hours 10 minutes 0 seconds  Total Procedure Duration: 0 hours 16 minutes 43 seconds       [facility]

## 2018-01-03 NOTE — Anesthesia Postprocedure Evaluation (Signed)
Anesthesia Post Note  Patient: Jermaine IbaCharles G Rantz Sr.  Procedure(s) Performed: COLONOSCOPY WITH PROPOFOL (N/A )  Patient location during evaluation: Endoscopy Anesthesia Type: General Level of consciousness: awake and alert and oriented Pain management: pain level controlled Vital Signs Assessment: post-procedure vital signs reviewed and stable Respiratory status: spontaneous breathing, nonlabored ventilation and respiratory function stable Cardiovascular status: blood pressure returned to baseline and stable Postop Assessment: no signs of nausea or vomiting Anesthetic complications: no     Last Vitals:  Vitals:   01/03/18 0814 01/03/18 0824  BP: (!) 123/57 130/67  Pulse: (!) 59 (!) 59  Resp: 18 13  Temp:    SpO2: 100% 100%    Last Pain:  Vitals:   01/03/18 0804  TempSrc: Tympanic                 Herbert Aguinaldo

## 2018-01-03 NOTE — Progress Notes (Signed)
MEDICATION RELATED CONSULT NOTE - INITIAL   Pharmacy Consult for Electrolyte Management Indication: Hypokalemia  No Known Allergies  Patient Measurements: Height: 5\' 8"  (172.7 cm) Weight: 217 lb (98.4 kg) IBW/kg (Calculated) : 68.4 Adjusted Body Weight:   Labs: BMP Latest Ref Rng & Units 01/03/2018 01/01/2018 12/31/2017  Glucose 65 - 99 mg/dL 90 91 95  BUN 6 - 20 mg/dL 16 29(F21(H) 62(Z23(H)  Creatinine 0.61 - 1.24 mg/dL 3.080.78 6.570.83 8.460.63  Sodium 135 - 145 mmol/L 145 139 142  Potassium 3.5 - 5.1 mmol/L 2.9(L) 3.9 3.3(L)  Chloride 101 - 111 mmol/L 102 105 105  CO2 22 - 32 mmol/L 31 26 29   Calcium 8.9 - 10.3 mg/dL 9.6(E8.5(L) 9.5(M8.8(L) 8.3(L)    Estimated Creatinine Clearance: 75.4 mL/min (by C-G formula based on SCr of 0.78 mg/dL).  Assessment: Patient is a 84yo male admitted for lower GI bleed. Pharmacy consulted for electrolyte management.   K resulted at 2.9, magnesium is within range at 2.0. Patient is currently ordered KCl 20mEq PO daily. Noted that patient just finished a bowel prep for colonoscopy.  Goal of Therapy:  Maintain electrolytes within range.  Plan:  Will order KCl 40mEq PO once. Will follow up on AM labs.  Clovia CuffLisa Arla Boutwell, PharmD, BCPS 01/03/2018 12:56 PM

## 2018-01-03 NOTE — Progress Notes (Signed)
Patient is medically stable for D/[facility]. [facility], auth # [id number], RVB. [facility] can come today to room 209-A. RN will call report at [phone] and arrange EMS for transport. Clinical Child psychotherapistocial Worker (CSW) sent D/C orders to [facility] via [employer]. Patient is aware of above. Patient's daughter Raynelle FanningJulie is at bedside and aware of above. Please reconsult if future social work needs arise. CSW signing off.   [employer]aker Hughes IncorporatedBailey Ferrell Flam, LCSW [phone]

## 2018-01-03 NOTE — Progress Notes (Signed)
[facility] [place] [facility]   PATIENT NAME: Jermaine [name]    MR#:  [id number]  DATE OF BIRTH:  [birth year]  SUBJECTIVE:   Patient had c/s this am    REVIEW OF SYSTEMS:    Review of Systems  Constitutional: Negative for fever, chills weight loss HENT: Negative for ear pain, nosebleeds, congestion, facial swelling, rhinorrhea, neck pain, neck stiffness and ear discharge.   Respiratory: Negative for cough, shortness of breath, wheezing  Cardiovascular: Negative for chest pain, palpitations and leg swelling.  Gastrointestinal: Negative for heartburn, abdominal pain, vomiting, diarrhea or consitpation Positive dark-colored stools /bloody bowel movements Genitourinary: Negative for dysuria, urgency, frequency, hematuria Musculoskeletal: Negative for back pain or joint pain Neurological: Negative for dizziness, seizures, syncope, focal weakness,  numbness and headaches.  He has Parkinson's disease Hematological: Does not bruise/bleed easily.  Psychiatric/Behavioral: Confusion has improved   Tolerating Diet: yes    DRUG ALLERGIES:  No Known Allergies  VITALS:  Blood pressure (!) 144/118, pulse 67, temperature 97.8 F (36.6 C), temperature source Oral, resp. rate 15, height 5\' 8"  (1.727 m), weight 98.4 kg (217 lb), SpO2 100 %.  PHYSICAL EXAMINATION:  Constitutional: Appears well-developed and well-nourished. No distress. HENT: Normocephalic. Marland Kitchen. Oropharynx is clear and moist.  Eyes: Conjunctivae and EOM are normal. PERRLA, no scleral icterus.  Neck: Normal ROM. Neck supple. No JVD. No tracheal deviation. CVS: RRR, S1/S2 +, no murmurs, no gallops, no carotid bruit.  Pulmonary: Effort and breath sounds normal, no stridor, rhonchi, wheezes, rales.  Abdominal: Soft. BS +,  no distension, tenderness, rebound or guarding.  Musculoskeletal: Normal range of motion. No edema and no tenderness.  Neuro: Alert. CN 2-12 grossly intact. No focal deficits. Skin: Skin is  warm and dry. No rash noted. Psychiatric: Normal mood and affect.      LABORATORY PANEL:   CBC Recent Labs  Lab 01/03/18 0330  WBC 6.3  HGB 9.6*  HCT 29.2*  PLT 123*   ------------------------------------------------------------------------------------------------------------------  Chemistries  Recent Labs  Lab 12/30/17 1043  01/03/18 0330  NA 143   < > 145  K 4.0   < > 2.9*  CL 106   < > 102  CO2 29   < > 31  GLUCOSE 104*   < > 90  BUN 24*   < > 16  CREATININE 0.69   < > 0.78  CALCIUM 8.8*   < > 8.5*  AST 19  --   --   ALT <5*  --   --   ALKPHOS 64  --   --   BILITOT 1.3*  --   --    < > = values in this interval not displayed.   ------------------------------------------------------------------------------------------------------------------  Cardiac Enzymes No results for input(s): TROPONINI in the last 168 hours. ------------------------------------------------------------------------------------------------------------------  RADIOLOGY:  No results found.   ASSESSMENT AND PLAN:   84 year old male with history of diabetes and Parkinson's disease who presents with rectal bleeding.  1. Rectal bleeding/maroon stools:  EGD shows nonbleeding gastric ulcers Bleeding with gastritis and multiple nonbleeding duodenal ulcers Colonoscopy showed no source of bleeding Follow up on stool H. pylori antigen Repeat EGD in 8 weeks to check for healing of multiple gastric and duodenal ulcers Continue PPI daily  2. Acute blood loss anemia: Hemoglobin has remained stable and patient has not needed blood transfusion 3. History of Parkinson's disease: Continue Sinemet and amantadine 4. Chronic hypoxic respiratory failure due to COPD without signs of exacerbation: Continue oxygen  5. Diabetes: Continue sliding scale  6. Hyperlipidemia: Continue statin 7. Hypokalemia: I will check magnesium level and replete potassium 8. Essential hypertension: Continue Coreg  9.  Chronic systolic heart failure EF of 20%: No signs of exacerbation Continue torsemide Monitor daily weight and intake and output  10. Restless leg syndrome: Continue Requip   Management plans discussed with the patient and he is in agreement.  CODE STATUS: Full  TOTAL TIME TAKING CARE OF THIS PATIENT: 24 minutes.   Physical therapy consultation for discharge planning  POSSIBLE D/C tomorrow to skilled nursing facility, DEPENDING ON CLINICAL CONDITION.   Deaisa Merida M.D on 01/03/2018 at 11:27 AM  Between 7am to 6pm - Pager - [phone] After 6pm go to [email or web address] - Social research officer, government  [facility]  Office  [phone]  CC: Primary care physician; Lauro Regulus, MD  Note: This dictation was prepared with Dragon dictation along with smaller phrase technology. Any transcriptional errors that result from this process are unintentional.

## 2018-01-03 NOTE — Progress Notes (Signed)
Discharge of patient completed. EMS arrived and took patient for [facility]. Patient's daughter at bedside and took patient's belongings[facility] called and notified that patient on the way.

## 2018-01-04 ENCOUNTER — Encounter: Payer: Self-pay | Admitting: Gastroenterology

## 2018-01-04 DIAGNOSIS — K259 Gastric ulcer, unspecified as acute or chronic, without hemorrhage or perforation: Secondary | ICD-10-CM | POA: Insufficient documentation

## 2018-01-05 ENCOUNTER — Telehealth: Payer: Self-pay

## 2018-01-05 DIAGNOSIS — A048 Other specified bacterial intestinal infections: Secondary | ICD-10-CM

## 2018-01-05 DIAGNOSIS — D649 Anemia, unspecified: Secondary | ICD-10-CM | POA: Diagnosis not present

## 2018-01-05 LAB — CBC WITH DIFFERENTIAL/PLATELET
Basophils Absolute: 0 10*3/uL (ref 0–0.1)
Basophils Relative: 1 %
EOS PCT: 2 %
Eosinophils Absolute: 0.1 10*3/uL (ref 0–0.7)
HEMATOCRIT: 28.4 % — AB (ref 40.0–52.0)
Hemoglobin: 9.2 g/dL — ABNORMAL LOW (ref 13.0–18.0)
LYMPHS PCT: 10 %
Lymphs Abs: 0.5 10*3/uL — ABNORMAL LOW (ref 1.0–3.6)
MCH: 28.8 pg (ref 26.0–34.0)
MCHC: 32.3 g/dL (ref 32.0–36.0)
MCV: 89.2 fL (ref 80.0–100.0)
MONO ABS: 0.3 10*3/uL (ref 0.2–1.0)
MONOS PCT: 6 %
NEUTROS ABS: 4.4 10*3/uL (ref 1.4–6.5)
Neutrophils Relative %: 81 %
Platelets: 136 10*3/uL — ABNORMAL LOW (ref 150–440)
RBC: 3.18 MIL/uL — ABNORMAL LOW (ref 4.40–5.90)
RDW: 16.3 % — AB (ref 11.5–14.5)
WBC: 5.3 10*3/uL (ref 3.8–10.6)

## 2018-01-05 MED ORDER — AMOXICILLIN 500 MG PO CAPS: 1000.0000 mg | ORAL_CAPSULE | Freq: Two times a day (BID) | ORAL | 0 refills | Status: DC

## 2018-01-05 MED ORDER — CLARITHROMYCIN 500 MG PO TABS: 500.0000 mg | ORAL_TABLET | Freq: Two times a day (BID) | ORAL | 0 refills | Status: DC

## 2018-01-05 NOTE — Telephone Encounter (Signed)
LVM for patient callback for results per Dr. Tobi BastosAnna. Ad[facility].    - inform patient - Gastric ulcer biopsy - H pylori positive- needs omeprazole 40mg  once daily , amoxicillin 1 gram BID and clarithromycin 500 mg BID for 14 days, recheck H pylori stool antigen in 6 weeks . Check for penicillin allergy .  NKDA. Sent Rx to pharmacy for treatment.

## 2018-01-10 ENCOUNTER — Telehealth: Payer: Self-pay | Admitting: Gastroenterology

## 2018-01-10 ENCOUNTER — Encounter: Payer: Self-pay | Admitting: Gastroenterology

## 2018-01-10 ENCOUNTER — Ambulatory Visit (INDEPENDENT_AMBULATORY_CARE_PROVIDER_SITE_OTHER): Payer: Medicare Other | Admitting: Gastroenterology

## 2018-01-10 VITALS — BP 101/58 | HR 70 | Temp 98.2°F | Ht 68.0 in | Wt 199.0 lb

## 2018-01-10 DIAGNOSIS — K279 Peptic ulcer, site unspecified, unspecified as acute or chronic, without hemorrhage or perforation: Secondary | ICD-10-CM

## 2018-01-10 DIAGNOSIS — K5900 Constipation, unspecified: Secondary | ICD-10-CM | POA: Diagnosis not present

## 2018-01-10 DIAGNOSIS — A048 Other specified bacterial intestinal infections: Secondary | ICD-10-CM | POA: Diagnosis not present

## 2018-01-10 MED ORDER — CLARITHROMYCIN 500 MG PO TABS: 500.0000 mg | ORAL_TABLET | Freq: Two times a day (BID) | ORAL | 0 refills | Status: AC

## 2018-01-10 MED ORDER — AMOXICILLIN 500 MG PO CAPS: 1000.0000 mg | ORAL_CAPSULE | Freq: Two times a day (BID) | ORAL | 0 refills | Status: AC

## 2018-01-10 MED ORDER — PANTOPRAZOLE SODIUM 40 MG PO TBEC: 40.0000 mg | DELAYED_RELEASE_TABLET | Freq: Two times a day (BID) | ORAL | 0 refills | Status: DC

## 2018-01-10 NOTE — Progress Notes (Signed)
Wyline MoodKiran Lekeisha Arenas MD, MRCP(U.K) [address]  [address]  [place], [place] [address]  Main: [phone]  Fax: [phone]   Primary Care Physician: Lauro RegulusAnderson, [name], MD  Primary Gastroenterologist:  Dr. Wyline MoodKiran [facility]   Chief Complaint  Patient presents with  . Duodenal Ulcers    HPI: Jermaine IbaCharles G Bannan Sr. is a 84 y.o. male   Summary of history :  He is here today as a hospital follow up. He was admitted on 12/30/17 with hematochezia and seen by Dr Servando SnareWohl as a consult. Hb dropped to 9.1 grams with a baseline around 13 grams.   01/02/18: EGD: showed 2 non bleeding clean based duodenal ulcers and few non-bleeding superficial gastric ulcers with no stigmata of bleeding . In addition gastritis noted. Gastric bx result showed gastritis with H pylori and we advised to take a course of antibiotics to eradicate the same.   01/02/18 : colonoscopy- diverticulosis of the colon     Interval history   01/03/2017-  1/29//2018   Brown stool, no abdominal pain. Unclear if he did take antibiotics or not for H pylori . Not having bowel movements daily.     Current Outpatient Medications  Medication Sig Dispense Refill  . allopurinol (ZYLOPRIM) 300 MG tablet Take 150 mg by mouth daily.     . Amantadine HCl 100 MG tablet Take 100 mg by mouth 2 (two) times daily. Take with CARBIDOPA-LEVODOPA  0  . carbidopa-levodopa (SINEMET IR) 25-250 MG tablet Take 2-2.5 tablets by mouth 2 (two) times daily.     . carvedilol (COREG) 3.125 MG tablet Take 3.125 mg by mouth 2 (two) times daily with a meal.    . cholecalciferol (VITAMIN D) 1000 UNITS tablet Take 1,000 Units by mouth daily.    . Melatonin 5 MG TABS Take 5 mg by mouth at bedtime.     . pantoprazole (PROTONIX) 40 MG tablet Take 1 tablet (40 mg total) by mouth 2 (two) times daily. 118 tablet 0  . polyethylene glycol (MIRALAX / GLYCOLAX) packet Take 17 g by mouth daily as needed for mild constipation.    . potassium chloride SA (K-DUR,KLOR-CON) 20 MEQ  tablet Take 20 mEq by mouth daily.    . pravastatin (PRAVACHOL) 20 MG tablet Take 20 mg by mouth at bedtime.     Marland Kitchen. rOPINIRole (REQUIP) 1 MG tablet Take 1 mg by mouth 2 (two) times daily.     Marland Kitchen. torsemide (DEMADEX) 20 MG tablet Take 20 mg by mouth daily.     No current facility-administered medications for this visit.     Allergies as of 01/10/2018  . (No Known Allergies)    ROS:  General: Negative for anorexia, weight loss, fever, chills, fatigue, weakness. ENT: Negative for hoarseness, difficulty swallowing , nasal congestion. CV: Negative for chest pain, angina, palpitations, dyspnea on exertion, peripheral edema.  Respiratory: Negative for dyspnea at rest, dyspnea on exertion, cough, sputum, wheezing.  GI: See history of present illness. GU:  Negative for dysuria, hematuria, urinary incontinence, urinary frequency, nocturnal urination.  Endo: Negative for unusual weight change.    Physical Examination:   BP (!) 101/58   Pulse 70   Temp 98.2 F (36.8 C) (Oral)   Ht 5\' 8"  (1.727 m)   Wt 199 lb (90.3 kg)   BMI 30.26 kg/m   General: Well-nourished, well-developed in no acute distress.  Eyes: No icterus. Conjunctivae pink. Mouth: Oropharyngeal mucosa moist and pink , no lesions erythema or exudate. Lungs: Clear to  auscultation bilaterally. Non-labored. Heart: Regular rate and rhythm, no murmurs rubs or gallops.  Abdomen: Bowel sounds are normal, nontender, nondistended, no hepatosplenomegaly or masses, no abdominal bruits or hernia , no rebound or guarding.   Extremities: No lower extremity edema. No clubbing or deformities. Neuro: Alert and oriented x 3.  Grossly intact. Skin: Warm and dry, no jaundice.   Psych: Alert and cooperative, normal mood and affect.   Imaging Studies: No results found.  Assessment and Plan:   Jermaine SEK Sr. is a 84 y.o. y/o male with recent hospital discharge from a upper GI bleed secondary to gastric and duodenal ulcer secondary to H  pylori . [facility] for [facility] , unclear if he has or not received it at the Nursing home. (clarithromycin 500 mg PO BID, amoxicillin 1 gram TID, omeprazole 20 mg BID all for 14 days.). He had been on NSAID's which he has stopped.  .    Plan  1. Check H pylori stool  test to confirm eradication of H pylori after antibiotic course 2. Repeat EGD in 8 weeks to check for healing of the ulcers after H pylori was eradicated  3. No NSAID's 4. Continue PPI 5. My office will call and check his nursing home to ensure he has receviced H pylori antibiotics.  6. Copy of my note given to nursing home  7. Suggest daily miralax for any constipation   I have discussed alternative options, risks & benefits,  which include, but are not limited to, bleeding, infection, perforation,respiratory complication & drug reaction.  The patient agrees with this plan & written consent will be obtained.     Dr Wyline Mood  MD,MRCP [facility]) Follow up in 12 weeks.

## 2018-01-10 NOTE — Telephone Encounter (Signed)
Patients daughter called and stated that she didn't get the instruction sheet to give to the staff [facility]. Please call her at [phone] and get that info to them please

## 2018-01-11 NOTE — Telephone Encounter (Signed)
Spoke to Jermaine [name] at [facility]. She confirmed that patient is receiving H.Pylori treatment medications.   Faxed [facility] notes to [facility] and [facility].   Called patient's daughter and confirmed that notes have been faxed.

## 2018-01-12 ENCOUNTER — Non-Acute Institutional Stay (SKILLED_NURSING_FACILITY): Payer: Medicare Other | Admitting: Gerontology

## 2018-01-12 DIAGNOSIS — K922 Gastrointestinal hemorrhage, unspecified: Secondary | ICD-10-CM

## 2018-01-13 ENCOUNTER — Encounter
Admission: RE | Admit: 2018-01-13 | Discharge: 2018-01-13 | Disposition: A | Discharge status: Home / Self Care | Payer: Medicare Other | Source: Ambulatory Visit | Attending: Internal Medicine | Admitting: Internal Medicine

## 2018-01-13 ENCOUNTER — Encounter: Payer: Self-pay | Admitting: Gerontology

## 2018-01-13 NOTE — Progress Notes (Signed)
Location:   [facility] Room Number: 212A Place of Service:  SNF [phone]) Provider:  Lorenso QuarryShannon Edgel Degnan, NP-C  Lauro RegulusAnderson, [name], MD  Patient Care Team: Lauro RegulusAnderson, [name], MD as PCP - General (Internal Medicine) Delma FreezeHackney, Tina A, FNP as Nurse Practitioner (Family Medicine) Lamar BlinksKowalski, Bruce J, MD as Consulting Physician (Cardiology) Lonell FaceShah, Hemang K, MD as Consulting Physician (Neurology)  Extended Emergency Contact Information Primary Emergency Contact: [facility]Julie Address: [address]          [place], [place] [address] [place] Home Phone: [phone] Relation: Daughter Secondary Emergency Contact: Hino, chuck Mobile Phone: [phone] Relation: Son  Code Status:  DNR Goals of care: Advanced Directive information Advanced Directives 01/13/2018  Does Patient Have a Medical Advance Directive? Yes  Type of Estate agentAdvance Directive Healthcare Power of SeaboardAttorney;Out of facility DNR (pink MOST or yellow form)  Does patient want to make changes to medical advance directive? No - Patient declined  Copy of Healthcare Power of Attorney in Chart? Yes  Would patient like information on creating a medical advance directive? -  Pre-existing out of facility DNR order (yellow form or pink MOST form) Yellow form placed in chart (order not valid for inpatient use)     Chief Complaint  Patient presents with  . Medical Management of Chronic Issues    Routine Visit    HPI:  Pt is a 84 y.o. male seen today for medical management of chronic diseases. Pt was admitted to the facility for rehab following [facility]. Pt has been participating in PT/OT for generalized weakness and Deconditioning. Pt is A&O. NAD. Pleasant. He denies pain. Pt denies chest pain or shortness of breath. Denies melena or hematuria. No n/v/d/f/c. No dizziness, increased weakness, cough or congestion. O2 in place per home regimen. VSS. No other complaints.       Past  Medical History:  Diagnosis Date  . CAD (coronary artery disease)   . CHF (congestive heart failure) (HCC)   . COPD (chronic obstructive pulmonary disease) (HCC)   . Diabetes mellitus without complication (HCC)   . GERD (gastroesophageal reflux disease)    with dysphagia  . Gout   . Heart murmur    unspecified, benign, by exam  . Hyperglycemia, unspecified   . Hyperlipidemia, unspecified   . Hypertension    difficult to control  . LVH (left ventricular hypertrophy)   . Obesity, unspecified   . OSA (obstructive sleep apnea)   . Pacemaker   . Parkinson's disease (HCC) 2008  . Sleep apnea    on CPAP   Past Surgical History:  Procedure Laterality Date  . APPENDECTOMY    . CATARACT EXTRACTION  2010  . COLONOSCOPY  08/05/2004  . COLONOSCOPY WITH PROPOFOL N/A 01/02/2018   Procedure: COLONOSCOPY WITH PROPOFOL;  Surgeon: Wyline MoodAnna, Kiran, MD;  [facility];  Service: Gastroenterology;  Laterality: N/A;  . COLONOSCOPY WITH PROPOFOL N/A 01/03/2018   Procedure: COLONOSCOPY WITH PROPOFOL;  Surgeon: Wyline MoodAnna, Kiran, MD;  [facility];  Service: Gastroenterology;  Laterality: N/A;  . CORONARY ARTERY BYPASS GRAFT  2008   double   . ESOPHAGOGASTRODUODENOSCOPY (EGD) WITH PROPOFOL N/A 01/02/2018   Procedure: ESOPHAGOGASTRODUODENOSCOPY (EGD) WITH PROPOFOL;  Surgeon: Wyline MoodAnna, Kiran, MD;  [facility];  Service: Gastroenterology;  Laterality: N/A;  . KNEE ARTHROSCOPY Right   . PACEMAKER PLACEMENT N/A 2016    No Known Allergies  Allergies as of 01/12/2018   No Known Allergies  Medication List        Accurate as of 01/12/18 11:59 PM. Always use your most recent med list.          allopurinol 300 MG tablet Commonly known as:  ZYLOPRIM Take 150 mg by mouth daily.   Amantadine HCl 100 MG tablet Take 100 mg by mouth 2 (two) times daily. Take with CARBIDOPA-LEVODOPA   amoxicillin 500 MG capsule Commonly known as:  AMOXIL Take 2 capsules (1,000 mg total) by  mouth 2 (two) times daily for 14 days.   carbidopa-levodopa 50-200 MG tablet Commonly known as:  SINEMET CR Take by mouth 2 (two) times daily. Give 2 1/2 tablets   carvedilol 3.125 MG tablet Commonly known as:  COREG Take 3.125 mg by mouth 2 (two) times daily with a meal.   cholecalciferol 1000 units tablet Commonly known as:  VITAMIN D Take 1,000 Units by mouth daily.   clarithromycin 500 MG tablet Commonly known as:  BIAXIN Take 1 tablet (500 mg total) by mouth 2 (two) times daily for 14 days.   Melatonin 5 MG Tabs Take 5 mg by mouth at bedtime.   OXYGEN Inhale 2 L into the lungs continuous.   pantoprazole 40 MG tablet Commonly known as:  PROTONIX Take 1 tablet (40 mg total) by mouth 2 (two) times daily.   polyethylene glycol packet Commonly known as:  MIRALAX / GLYCOLAX Take 17 g by mouth daily as needed for mild constipation.   potassium chloride SA 20 MEQ tablet Commonly known as:  K-DUR,KLOR-CON Take 20 mEq by mouth daily.   pravastatin 20 MG tablet Commonly known as:  PRAVACHOL Take 20 mg by mouth at bedtime.   rOPINIRole 1 MG tablet Commonly known as:  REQUIP Take 1 mg by mouth 2 (two) times daily.   torsemide 20 MG tablet Commonly known as:  DEMADEX Take 20 mg by mouth daily.       Review of Systems  Constitutional: Negative for activity change, appetite change, chills, diaphoresis and fever.  HENT: Negative for congestion, mouth sores, nosebleeds, postnasal drip, sneezing, sore throat, trouble swallowing and voice change.   Respiratory: Negative for apnea, cough, choking, chest tightness, shortness of breath and wheezing.   Cardiovascular: Negative for chest pain, palpitations and leg swelling.  Gastrointestinal: Negative for abdominal distention, abdominal pain, constipation, diarrhea and nausea.  Genitourinary: Negative for difficulty urinating, dysuria, frequency and urgency.  Musculoskeletal: Negative for back pain, gait problem and myalgias.  Arthralgias: typical arthritis.  Skin: Negative for color change, pallor, rash and wound.  Neurological: Positive for weakness. Negative for dizziness, tremors, syncope, speech difficulty, numbness and headaches.  Psychiatric/Behavioral: Negative for agitation and behavioral problems.  All other systems reviewed and are negative.   Immunization History  Administered Date(s) Administered  . Influenza Split 11/11/2014  . Influenza-Unspecified 09/17/2013, 10/21/2015, 11/18/2016, 10/03/2017  . Pneumococcal Polysaccharide-23 05/01/2013   Pertinent  Health Maintenance Due  Topic Date Due  . FOOT EXAM  03/26/1939  . OPHTHALMOLOGY EXAM  03/26/1939  . URINE MICROALBUMIN  03/26/1939  . PNA vac Low Risk Adult (2 of 2 - PCV13) 05/01/2014  . HEMOGLOBIN A1C  06/29/2018  . INFLUENZA VACCINE  Completed   Fall Risk  05/06/2017 02/17/2017 11/17/2016 07/07/2016 03/22/2016  Falls in the past year? No Yes No No No  Number falls in past yr: - 1 - - -  Injury with Fall? - No - - -  Risk for fall due to : History of fall(s) Impaired balance/gait -  History of fall(s) -  Follow up - Falls prevention discussed - - -   Functional Status Survey:    Vitals:   01/12/18 1209  BP: (!) 123/59  Pulse: 64  Resp: 20  Temp: 98.5 F (36.9 C)  TempSrc: Oral  SpO2: 100%  Weight: 192 lb 1.6 oz (87.1 kg)  Height: 5\' 8"  (1.727 m)   Body mass index is 29.21 kg/m. Physical Exam  Constitutional: He is oriented to person, place, and time. Vital signs are normal. He appears well-developed and well-nourished. He is active and cooperative. He does not appear ill. No distress. Nasal cannula in place.  HENT:  Head: Normocephalic and atraumatic.  Mouth/Throat: Uvula is midline, oropharynx is clear and moist and mucous membranes are normal. Mucous membranes are not pale, not dry and not cyanotic.  Eyes: Conjunctivae, EOM and lids are normal. Pupils are equal, round, and reactive to light.  Neck: Trachea normal, normal range  of motion and full passive range of motion without pain. Neck supple. No JVD present. No tracheal deviation, no edema and no erythema present. No thyromegaly present.  Cardiovascular: Normal rate, regular rhythm, intact distal pulses and normal pulses. Exam reveals no gallop, no distant heart sounds and no friction rub.  Murmur heard. Pulses:      Dorsalis pedis pulses are 2+ on the right side, and 2+ on the left side.  1+ chronic BLE edema  Pulmonary/Chest: Effort normal and breath sounds normal. No accessory muscle usage. No respiratory distress. He has no decreased breath sounds. He has no wheezes. He has no rhonchi. He has no rales. He exhibits no tenderness.  Abdominal: Soft. Normal appearance and bowel sounds are normal. He exhibits no distension and no ascites. There is no tenderness.  Musculoskeletal: Normal range of motion. He exhibits no edema or tenderness.  Expected osteoarthritis, stiffness; Bilateral Calves soft, supple. Negative Homan's Sign. B- pedal pulses equal; generalized weakness  Neurological: He is alert and oriented to person, place, and time. He has normal strength. Coordination and gait abnormal.  Skin: Skin is warm, dry and intact. He is not diaphoretic. No cyanosis. No pallor. Nails show no clubbing.  Psychiatric: He has a normal mood and affect. His speech is normal and behavior is normal. Judgment and thought content normal. Cognition and memory are normal.  Nursing note and vitals reviewed.   Labs reviewed: Recent Labs    12/31/17 0301 01/01/18 0611 01/03/18 0330  NA 142 139 145  K 3.3* 3.9 2.9*  CL 105 105 102  CO2 29 26 31   GLUCOSE 95 91 90  BUN 23* 21* 16  CREATININE 0.63 0.83 0.78  CALCIUM 8.3* 8.8* 8.5*  MG  --   --  2.0   Recent Labs    12/30/17 1043  AST 19  ALT <5*  ALKPHOS 64  BILITOT 1.3*  PROT 6.6  ALBUMIN 3.6   Recent Labs    01/02/18 0339 01/03/18 0330 01/05/18 0500  WBC 6.0 6.3 5.3  NEUTROABS  --   --  4.4  HGB 9.4* 9.6*  9.2*  HCT 28.7* 29.2* 28.4*  MCV 88.8 89.5 89.2  PLT 123* 123* 136*   Lab Results  Component Value Date   TSH 2.198 03/12/2016   Lab Results  Component Value Date   HGBA1C 5.1 12/30/2017   No results found for: CHOL, HDL, LDLCALC, LDLDIRECT, TRIG, CHOLHDL  Significant Diagnostic Results in last 30 days:  No results found.  Assessment/Plan  Lower GI Bleed  Continue working with PT/OT  Continue to ambulate with assistance/ assistive devices  Monitor stools for melena  Continue antibiotics for H-pylori as prescribed by GI MD  Continue O2 2L [place] continuous  Family/ staff Communication:   Total Time:  Documentation:  Face to Face:  Family/Phone:   Labs/tests ordered:    Medication list reviewed and assessed for continued appropriateness. Monthly medication orders reviewed and signed.  Brynda Rim, NP-C Geriatrics [facility] [phone], [place] [address] Cell Phone (Mon-Fri 8am-5pm):  [phone] On Call:  [phone] & follow prompts after 5pm & weekends Office Phone:  [phone] Office Fax:  [phone]

## 2018-01-18 ENCOUNTER — Encounter: Payer: Self-pay | Admitting: Gerontology

## 2018-01-18 ENCOUNTER — Non-Acute Institutional Stay (SKILLED_NURSING_FACILITY): Payer: Medicare Other | Admitting: Gerontology

## 2018-01-18 DIAGNOSIS — K5909 Other constipation: Secondary | ICD-10-CM | POA: Diagnosis not present

## 2018-01-18 DIAGNOSIS — K922 Gastrointestinal hemorrhage, unspecified: Secondary | ICD-10-CM

## 2018-01-18 NOTE — Progress Notes (Signed)
Location:   [facility] Room Number: 212A Place of Service:  SNF [phone])  Provider: Lorenso Quarry, NP-C  PCP: Lauro Regulus, MD Patient Care Team: Lauro Regulus, MD as PCP - General (Internal Medicine) Delma Freeze, FNP as Nurse Practitioner (Family Medicine) Lamar Blinks, MD as Consulting Physician (Cardiology) Lonell Face, MD as Consulting Physician (Neurology)  Extended Emergency Contact Information Primary Emergency Contact: [facility] Address: [address]          [place], [place] [address] [place] Home Phone: [phone] Relation: Daughter Secondary Emergency Contact: Fails, chuck Mobile Phone: [phone] Relation: Son  Code Status: DNR Goals of care:  Advanced Directive information Advanced Directives 01/18/2018  Does Patient Have a Medical Advance Directive? Yes  Type of Estate agent of [place];Out of facility DNR (pink MOST or yellow form)  Does patient want to make changes to medical advance directive? No - Patient declined  Copy of Healthcare Power of Attorney in Chart? Yes  Would patient like information on creating a medical advance directive? -  Pre-existing out of facility DNR order (yellow form or pink MOST form) -     No Known Allergies  Chief Complaint  Patient presents with  . Discharge Note    Discharged from SNF    HPI:  84 y.o. male seen today for discharge evaluation. Pt was admitted to the facility for rehab for generalized weakness and deconditioning following hospitalization for GI bleed. Pt has been participating in PT/OT. He is mobile with rolling walker. He does report getting tired and short of breath easily still, with activity. O2 2L [place] in place. Pt denies n/v/d/f/c/cp/sob (when at rest)/ha/abd pain/dizziness/cough/ congestion. Pt does c/o constipation, even though last documented bowel movement was yesterday evening. Otherwise, pt reports he is  feeling well and ready to go home. He reports his appetite is good and is voiding well. VSS. No other complaints.     Past Medical History:  Diagnosis Date  . CAD (coronary artery disease)   . CHF (congestive heart failure) (HCC)   . COPD (chronic obstructive pulmonary disease) (HCC)   . Diabetes mellitus without complication (HCC)   . GERD (gastroesophageal reflux disease)    with dysphagia  . Gout   . Heart murmur    unspecified, benign, by exam  . Hyperglycemia, unspecified   . Hyperlipidemia, unspecified   . Hypertension    difficult to control  . LVH (left ventricular hypertrophy)   . Obesity, unspecified   . OSA (obstructive sleep apnea)   . Pacemaker   . Parkinson's disease (HCC) 2008  . Sleep apnea    on CPAP    Past Surgical History:  Procedure Laterality Date  . APPENDECTOMY    . CATARACT EXTRACTION  2010  . COLONOSCOPY  08/05/2004  . COLONOSCOPY WITH PROPOFOL N/A 01/02/2018   Procedure: COLONOSCOPY WITH PROPOFOL;  Surgeon: Wyline Mood, MD;  [facility] ENDOSCOPY;  Service: Gastroenterology;  Laterality: N/A;  . COLONOSCOPY WITH PROPOFOL N/A 01/03/2018   Procedure: COLONOSCOPY WITH PROPOFOL;  Surgeon: Wyline Mood, MD;  Location: [facility] ENDOSCOPY;  Service: Gastroenterology;  Laterality: N/A;  . CORONARY ARTERY BYPASS GRAFT  2008   double   . ESOPHAGOGASTRODUODENOSCOPY (EGD) WITH PROPOFOL N/A 01/02/2018   Procedure: ESOPHAGOGASTRODUODENOSCOPY (EGD) WITH PROPOFOL;  Surgeon: Wyline Mood, MD;  Location: [facility] ENDOSCOPY;  Service: Gastroenterology;  Laterality: N/A;  . KNEE ARTHROSCOPY Right   . PACEMAKER PLACEMENT N/A 2016  reports that  has never smoked. he has never used smokeless tobacco. He reports that he does not drink alcohol or use drugs. Social History   Socioeconomic History  . Marital status: Widowed    Spouse name: Not on file  . Number of children: 4  . Years of education: 12+  . Highest education level: Associate degree: occupational,  Scientist, product/process development, or vocational program  Social Needs  . Financial resource strain: Not on file  . Food insecurity - worry: Not on file  . Food insecurity - inability: Not on file  . Transportation needs - medical: Not on file  . Transportation needs - non-medical: Not on file  Occupational History  . Not on file  Tobacco Use  . Smoking status: Never Smoker  . Smokeless tobacco: Never Used  Substance and Sexual Activity  . Alcohol use: No    Alcohol/week: 0.0 oz  . Drug use: No  . Sexual activity: No  Other Topics Concern  . Not on file  Social History Narrative   Former Technical sales engineer   No alcohol    Not a smoker   Widowed   4 children   DNR with [employer]   Functional Status Survey:    No Known Allergies  Pertinent  Health Maintenance Due  Topic Date Due  . FOOT EXAM  03/26/1939  . OPHTHALMOLOGY EXAM  03/26/1939  . URINE MICROALBUMIN  03/26/1939  . PNA vac Low Risk Adult (2 of 2 - PCV13) 05/01/2014  . HEMOGLOBIN A1C  06/29/2018  . INFLUENZA VACCINE  Completed    Medications: Allergies as of 01/18/2018   No Known Allergies     Medication List        Accurate as of 01/18/18  4:08 PM. Always use your most recent med list.          acetaminophen 325 MG tablet Commonly known as:  TYLENOL Take 650 mg by mouth every 4 (four) hours as needed. Pain / increased temp.   allopurinol 300 MG tablet Commonly known as:  ZYLOPRIM Take 150 mg by mouth daily.   Amantadine HCl 100 MG tablet Take 100 mg by mouth 2 (two) times daily. Take with CARBIDOPA-LEVODOPA   amoxicillin 500 MG capsule Commonly known as:  AMOXIL Take 2 capsules (1,000 mg total) by mouth 2 (two) times daily for 14 days.   ASPERCREME LIDOCAINE 4 % Ptch Generic drug:  Lidocaine Apply 1 patch topically daily. Apply to back for pain   carbidopa-levodopa 50-200 MG tablet Commonly known as:  SINEMET CR Take by mouth 2 (two) times daily. Give 2 1/2 tablets   carvedilol 3.125 MG tablet Commonly known as:   COREG Take 3.125 mg by mouth 2 (two) times daily with a meal.   cholecalciferol 1000 units tablet Commonly known as:  VITAMIN D Take 1,000 Units by mouth daily.   clarithromycin 500 MG tablet Commonly known as:  BIAXIN Take 1 tablet (500 mg total) by mouth 2 (two) times daily for 14 days.   Melatonin 5 MG Tabs Take 5 mg by mouth at bedtime.   OXYGEN Inhale 2 L into the lungs continuous.   pantoprazole 40 MG tablet Commonly known as:  PROTONIX Take 1 tablet (40 mg total) by mouth 2 (two) times daily.   polyethylene glycol packet Commonly known as:  MIRALAX / GLYCOLAX Take 17 g by mouth daily as needed for mild constipation.   potassium chloride SA 20 MEQ tablet Commonly known as:  K-DUR,KLOR-CON Take 20 mEq  by mouth daily.   pravastatin 20 MG tablet Commonly known as:  PRAVACHOL Take 20 mg by mouth at bedtime.   rOPINIRole 1 MG tablet Commonly known as:  REQUIP Take 1 mg by mouth 2 (two) times daily.   torsemide 20 MG tablet Commonly known as:  DEMADEX Take 20 mg by mouth daily.       Review of Systems  Constitutional: Negative for activity change, appetite change, chills, diaphoresis and fever.  HENT: Negative for congestion, mouth sores, nosebleeds, postnasal drip, sneezing, sore throat, trouble swallowing and voice change.   Respiratory: Negative for apnea, cough, choking, chest tightness, shortness of breath and wheezing.   Cardiovascular: Negative for chest pain, palpitations and leg swelling.  Gastrointestinal: Positive for constipation. Negative for abdominal distention, abdominal pain, diarrhea and nausea.  Genitourinary: Negative for difficulty urinating, dysuria, frequency and urgency.  Musculoskeletal: Negative for back pain, gait problem and myalgias. Arthralgias: typical arthritis.  Skin: Negative for color change, pallor, rash and wound.  Neurological: Positive for weakness. Negative for dizziness, tremors, syncope, speech difficulty, numbness and  headaches.  Psychiatric/Behavioral: Negative for agitation and behavioral problems.  All other systems reviewed and are negative.   Vitals:   01/18/18 1601  BP: 125/64  Pulse: 72  Resp: 20  Temp: 97.9 F (36.6 C)  TempSrc: Oral  SpO2: 99%  Weight: 193 lb 6.4 oz (87.7 kg)  Height: 5\' 8"  (1.727 m)   Body mass index is 29.41 kg/m. Physical Exam  Constitutional: He is oriented to person, place, and time. Vital signs are normal. He appears well-developed and well-nourished. He is active and cooperative. He does not appear ill. No distress. Nasal cannula in place.  HENT:  Head: Normocephalic and atraumatic.  Mouth/Throat: Uvula is midline, oropharynx is clear and moist and mucous membranes are normal. Mucous membranes are not pale, not dry and not cyanotic.  Eyes: Conjunctivae, EOM and lids are normal. Pupils are equal, round, and reactive to light.  Neck: Trachea normal, normal range of motion and full passive range of motion without pain. Neck supple. No JVD present. No tracheal deviation, no edema and no erythema present. No thyromegaly present.  Cardiovascular: Normal rate, regular rhythm, normal heart sounds, intact distal pulses and normal pulses. Exam reveals no gallop, no distant heart sounds and no friction rub.  No murmur heard. Pulses:      Dorsalis pedis pulses are 2+ on the right side, and 2+ on the left side.  No edema  Pulmonary/Chest: Effort normal. No accessory muscle usage. No respiratory distress. He has decreased breath sounds in the right lower field and the left lower field. He has no wheezes. He has no rhonchi. He has no rales. He exhibits no tenderness.  Abdominal: Soft. Normal appearance and bowel sounds are normal. He exhibits no distension and no ascites. There is no tenderness.  Musculoskeletal: Normal range of motion. He exhibits no edema or tenderness.  Expected osteoarthritis, stiffness; Bilateral Calves soft, supple. Negative Homan's Sign. B- pedal pulses  equal; generalized weakness  Neurological: He is alert and oriented to person, place, and time. He has normal strength. Coordination and gait abnormal.  Skin: Skin is warm, dry and intact. He is not diaphoretic. No cyanosis. No pallor. Nails show no clubbing.  Psychiatric: He has a normal mood and affect. His speech is normal and behavior is normal. Judgment and thought content normal. Cognition and memory are normal.  Nursing note and vitals reviewed.   Labs reviewed: Basic Metabolic Panel: Recent Labs  12/31/17 0301 01/01/18 0611 01/03/18 0330  NA 142 139 145  K 3.3* 3.9 2.9*  CL 105 105 102  CO2 29 26 31   GLUCOSE 95 91 90  BUN 23* 21* 16  CREATININE 0.63 0.83 0.78  CALCIUM 8.3* 8.8* 8.5*  MG  --   --  2.0   Liver Function Tests: Recent Labs    12/30/17 1043  AST 19  ALT <5*  ALKPHOS 64  BILITOT 1.3*  PROT 6.6  ALBUMIN 3.6   No results for input(s): LIPASE, AMYLASE in the last 8760 hours. No results for input(s): AMMONIA in the last 8760 hours. CBC: Recent Labs    01/02/18 0339 01/03/18 0330 01/05/18 0500  WBC 6.0 6.3 5.3  NEUTROABS  --   --  4.4  HGB 9.4* 9.6* 9.2*  HCT 28.7* 29.2* 28.4*  MCV 88.8 89.5 89.2  PLT 123* 123* 136*   Cardiac Enzymes: No results for input(s): CKTOTAL, CKMB, CKMBINDEX, TROPONINI in the last 8760 hours. BNP: Invalid input(s): POCBNP CBG: Recent Labs    01/02/18 1654 01/02/18 2117 01/03/18 1217  GLUCAP 85 83 104*    Procedures and Imaging Studies During Stay: No results found.  Assessment/Plan:    Lower GI Bleed  Chronic Constipation   Continue to monitor closely for melena  Follow up with PCP and/or GI MD asap after discharge for follow up and monitoring of Hgb  Monitor for worsening constipation/ hard stools- keep stools soft  Continue Miralax 17 g po Q Day  Continue PT/OT  Ambulate with assistive devices   Continue O2 2L [place]  Patient is being discharged with the following home health services:  HHPT/OT/Nsh through [facility]   Patient is being discharged with the following durable medical equipment: none needed   Patient has been advised to f/u with their PCP in 1-2 weeks to bring them up to date on their rehab stay.  Social services at facility was responsible for arranging this appointment.  Pt was provided with a 30 day supply of prescriptions for medications and refills must be obtained from their PCP.  For controlled substances, a more limited supply may be provided adequate until PCP appointment only.  Future labs/tests needed:    Family/ staff Communication:   Total Time:  Documentation:  Face to Face:  Family/Phone:  Brynda Rim, NP-C Geriatrics [facility] [address], [place] [address] Cell Phone (Mon-Fri 8am-5pm):  [phone] On Call:  [phone] & follow prompts after 5pm & weekends Office Phone:  [phone] Office Fax:  [phone]

## 2018-02-02 ENCOUNTER — Other Ambulatory Visit
Admission: RE | Admit: 2018-02-02 | Discharge: 2018-02-02 | Disposition: A | Discharge status: Home / Self Care | Payer: Medicare Other | Source: Ambulatory Visit | Attending: Gastroenterology | Admitting: Gastroenterology

## 2018-02-02 DIAGNOSIS — B9681 Helicobacter pylori [H. pylori] as the cause of diseases classified elsewhere: Secondary | ICD-10-CM | POA: Insufficient documentation

## 2018-02-02 DIAGNOSIS — K2970 Gastritis, unspecified, without bleeding: Secondary | ICD-10-CM | POA: Diagnosis not present

## 2018-02-02 DIAGNOSIS — K269 Duodenal ulcer, unspecified as acute or chronic, without hemorrhage or perforation: Secondary | ICD-10-CM | POA: Diagnosis present

## 2018-02-04 LAB — H. PYLORI ANTIGEN, STOOL: H. Pylori Stool Ag, Eia: NEGATIVE

## 2018-02-12 ENCOUNTER — Encounter: Payer: Self-pay | Admitting: Gastroenterology

## 2018-02-20 ENCOUNTER — Telehealth: Payer: Self-pay

## 2018-02-20 NOTE — Telephone Encounter (Signed)
Advised Mr. Jermaine [name] per Dr. Tobi BastosAnna.   Need to schedule EGD to check for healing ulcers since eradicating H. Pylori.   Mr. Jermaine [name] daughter will be home tomorrow.  Advised we'd callback to schedule when she was available per Mr. Jermaine [name] request.

## 2018-03-14 ENCOUNTER — Telehealth: Payer: Self-pay | Admitting: Gastroenterology

## 2018-03-14 NOTE — Telephone Encounter (Signed)
Please call daughter Charline BillsJulies. She wants to know if he needs another EGD or office Visits. Patient was subscribed this in the hospital  Pantoprazole 40 mg. Does he need to continue this medicine?

## 2018-03-15 ENCOUNTER — Other Ambulatory Visit: Payer: Self-pay

## 2018-03-15 DIAGNOSIS — K279 Peptic ulcer, site unspecified, unspecified as acute or chronic, without hemorrhage or perforation: Secondary | ICD-10-CM

## 2018-03-15 NOTE — Addendum Note (Signed)
Addended by: Ardyth ManARTER, Kadija Cruzen Z on: 03/15/2018 04:03 PM   Modules accepted: Orders, SmartSet

## 2018-04-03 ENCOUNTER — Encounter: Payer: Self-pay | Admitting: Student

## 2018-04-04 ENCOUNTER — Ambulatory Visit: Payer: Medicare Other | Admitting: Anesthesiology

## 2018-04-04 ENCOUNTER — Encounter
Admission: RE | Disposition: A | Discharge status: Home / Self Care | Payer: Self-pay | Source: Ambulatory Visit | Attending: Gastroenterology

## 2018-04-04 ENCOUNTER — Ambulatory Visit
Admission: RE | Admit: 2018-04-04 | Discharge: 2018-04-04 | Disposition: A | Discharge status: Home / Self Care | Payer: Medicare Other | Source: Ambulatory Visit | Attending: Gastroenterology | Admitting: Gastroenterology

## 2018-04-04 DIAGNOSIS — Z950 Presence of cardiac pacemaker: Secondary | ICD-10-CM | POA: Diagnosis not present

## 2018-04-04 DIAGNOSIS — K253 Acute gastric ulcer without hemorrhage or perforation: Secondary | ICD-10-CM | POA: Diagnosis present

## 2018-04-04 DIAGNOSIS — Z951 Presence of aortocoronary bypass graft: Secondary | ICD-10-CM | POA: Insufficient documentation

## 2018-04-04 DIAGNOSIS — Z79899 Other long term (current) drug therapy: Secondary | ICD-10-CM | POA: Diagnosis not present

## 2018-04-04 DIAGNOSIS — E119 Type 2 diabetes mellitus without complications: Secondary | ICD-10-CM | POA: Diagnosis not present

## 2018-04-04 DIAGNOSIS — G20 Parkinson's disease: Secondary | ICD-10-CM | POA: Diagnosis not present

## 2018-04-04 DIAGNOSIS — J449 Chronic obstructive pulmonary disease, unspecified: Secondary | ICD-10-CM | POA: Insufficient documentation

## 2018-04-04 DIAGNOSIS — Z9981 Dependence on supplemental oxygen: Secondary | ICD-10-CM | POA: Insufficient documentation

## 2018-04-04 DIAGNOSIS — I2510 Atherosclerotic heart disease of native coronary artery without angina pectoris: Secondary | ICD-10-CM | POA: Insufficient documentation

## 2018-04-04 DIAGNOSIS — K279 Peptic ulcer, site unspecified, unspecified as acute or chronic, without hemorrhage or perforation: Secondary | ICD-10-CM

## 2018-04-04 DIAGNOSIS — I509 Heart failure, unspecified: Secondary | ICD-10-CM | POA: Diagnosis not present

## 2018-04-04 DIAGNOSIS — I110 Hypertensive heart disease with heart failure: Secondary | ICD-10-CM | POA: Diagnosis not present

## 2018-04-04 HISTORY — PX: ESOPHAGOGASTRODUODENOSCOPY (EGD) WITH PROPOFOL: SHX5813

## 2018-04-04 LAB — GLUCOSE, CAPILLARY: GLUCOSE-CAPILLARY: 95 mg/dL (ref 65–99)

## 2018-04-04 SURGERY — ESOPHAGOGASTRODUODENOSCOPY (EGD) WITH PROPOFOL
Anesthesia: General

## 2018-04-04 MED ORDER — LIDOCAINE HCL (CARDIAC) PF 100 MG/5ML IV SOSY
PREFILLED_SYRINGE | INTRAVENOUS | Status: DC | PRN
  Administered 2018-04-04: 60 mg via INTRAVENOUS

## 2018-04-04 MED ORDER — PROPOFOL 10 MG/ML IV BOLUS
INTRAVENOUS | Status: DC | PRN
  Administered 2018-04-04: 30 mg via INTRAVENOUS

## 2018-04-04 MED ORDER — PROPOFOL 500 MG/50ML IV EMUL
INTRAVENOUS | Status: DC | PRN
  Administered 2018-04-04: 25 ug/kg/min via INTRAVENOUS

## 2018-04-04 MED ORDER — SODIUM CHLORIDE 0.9 % IV SOLN
INTRAVENOUS | Status: DC
  Administered 2018-04-04: 1000 mL via INTRAVENOUS

## 2018-04-04 NOTE — H&P (Signed)
Wyline Mood, MD [address], [address], [place], [place], [address] [address], [place], [place], [address] Phone: [phone]  Fax: [phone]  Primary Care Physician:  Lauro Regulus, MD   Pre-Procedure History & Physical: HPI:  Jermaine Wilmeth. is a 84 y.o. male is here for an endoscopy    Past Medical History:  Diagnosis Date  . CAD (coronary artery disease)   . CHF (congestive heart failure) (HCC)   . COPD (chronic obstructive pulmonary disease) (HCC)   . Diabetes mellitus without complication (HCC)   . GERD (gastroesophageal reflux disease)    with dysphagia  . Gout   . Heart murmur    unspecified, benign, by exam  . Hyperglycemia, unspecified   . Hyperlipidemia, unspecified   . Hypertension    difficult to control  . LVH (left ventricular hypertrophy)   . Obesity, unspecified   . OSA (obstructive sleep apnea)   . Pacemaker   . Parkinson's disease (HCC) 2008  . Sleep apnea    on CPAP    Past Surgical History:  Procedure Laterality Date  . APPENDECTOMY    . CATARACT EXTRACTION  2010  . COLONOSCOPY  08/05/2004  . COLONOSCOPY WITH PROPOFOL N/A 01/02/2018   Procedure: COLONOSCOPY WITH PROPOFOL;  Surgeon: Wyline Mood, MD;  Location: [facility] ENDOSCOPY;  Service: Gastroenterology;  Laterality: N/A;  . COLONOSCOPY WITH PROPOFOL N/A 01/03/2018   Procedure: COLONOSCOPY WITH PROPOFOL;  Surgeon: Wyline Mood, MD;  Location: [facility] ENDOSCOPY;  Service: Gastroenterology;  Laterality: N/A;  . CORONARY ARTERY BYPASS GRAFT  2008   double   . ESOPHAGOGASTRODUODENOSCOPY (EGD) WITH PROPOFOL N/A 01/02/2018   Procedure: ESOPHAGOGASTRODUODENOSCOPY (EGD) WITH PROPOFOL;  Surgeon: Wyline Mood, MD;  Location: [facility] ENDOSCOPY;  Service: Gastroenterology;  Laterality: N/A;  . KNEE ARTHROSCOPY Right   . PACEMAKER PLACEMENT N/A 2016    Prior to Admission medications   Medication Sig Start Date End Date Taking? Authorizing Provider  acetaminophen (TYLENOL) 325 MG  tablet Take 650 mg by mouth every 4 (four) hours as needed. Pain / increased temp.   Yes [provider]  allopurinol (ZYLOPRIM) 300 MG tablet Take 150 mg by mouth daily.    Yes [provider]  Amantadine HCl 100 MG tablet Take 100 mg by mouth 2 (two) times daily. Take with CARBIDOPA-LEVODOPA 11/16/17  Yes [provider]  carbidopa-levodopa (SINEMET CR) 50-200 MG tablet Take by mouth 2 (two) times daily. Give 2 1/2 tablets   Yes [provider]  carvedilol (COREG) 3.125 MG tablet Take 3.125 mg by mouth 2 (two) times daily with a meal.    Yes [provider]  Lidocaine (ASPERCREME LIDOCAINE) 4 % PTCH Apply 1 patch topically daily. Apply to back for pain   Yes [provider]  Melatonin 5 MG TABS Take 5 mg by mouth at bedtime.    Yes [provider]  OXYGEN Inhale 2 L into the lungs continuous.   Yes [provider]  polyethylene glycol (MIRALAX / GLYCOLAX) packet Take 17 g by mouth daily as needed for mild constipation.   Yes [provider]  potassium chloride SA (K-DUR,KLOR-CON) 20 MEQ tablet Take 20 mEq by mouth daily.    Yes [provider]  pravastatin (PRAVACHOL) 20 MG tablet Take 20 mg by mouth at bedtime.    Yes [provider]  rOPINIRole (REQUIP) 1 MG tablet Take 1 mg by mouth 2 (two) times daily.    Yes [provider]  torsemide (DEMADEX) 20 MG tablet Take 20 mg by mouth daily.    Yes [provider]  cholecalciferol (VITAMIN D) 1000 UNITS tablet Take 1,000 Units by mouth daily.    [provider]  pantoprazole (PROTONIX) 40 MG tablet Take 1 tablet (40 mg total) by mouth 2 (two) times daily. 01/10/18 03/10/18  Wyline Mood, MD    Allergies as of 03/16/2018  . (No Known Allergies)    Family History  Problem Relation Age of Onset  . Cancer Mother        gallbladder  . Parkinson's disease Brother   . Hypertension Other   . Anemia Neg Hx   . Arrhythmia Neg Hx    . Asthma Neg Hx   . Clotting disorder Neg Hx   . Fainting Neg Hx   . Heart attack Neg Hx   . Heart disease Neg Hx   . Heart failure Neg Hx   . Hyperlipidemia Neg Hx     Social History   Socioeconomic History  . Marital status: Widowed    Spouse name: Not on file  . Number of children: 4  . Years of education: 12+  . Highest education level: Associate degree: occupational, Scientist, product/process development, or vocational program  Occupational History  . Not on file  Social Needs  . Financial resource strain: Not on file  . Food insecurity:    Worry: Not on file    Inability: Not on file  . Transportation needs:    Medical: Not on file    Non-medical: Not on file  Tobacco Use  . Smoking status: Never Smoker  . Smokeless tobacco: Never Used  Substance and Sexual Activity  . Alcohol use: No    Alcohol/week: 0.0 oz  . Drug use: No  . Sexual activity: Never  Lifestyle  . Physical activity:    Days per week: Not on file    Minutes per session: Not on file  . Stress: Not on file  Relationships  . Social connections:    Talks on phone: Not on file    Gets together: Not on file    Attends religious service: Not on file    Active member of club or organization: Not on file    Attends meetings of clubs or organizations: Not on file    Relationship status: Not on file  . Intimate partner violence:    Fear of current or ex partner: Not on file    Emotionally abused: Not on file    Physically abused: Not on file    Forced sexual activity: Not on file  Other Topics Concern  . Not on file  Social History Narrative   Former Technical sales engineer   No alcohol    Not a smoker   Widowed   4 children   DNR with [facility]    Review of Systems: See HPI, otherwise negative ROS  Physical Exam: BP 138/80   Pulse 75   Temp (!) 96.8 F (36 C) (Tympanic)   Resp 16   Ht 5\' 8"  (1.727 m)   Wt 196 lb (88.9 kg)   SpO2 98%   BMI 29.80 kg/m  General:   Alert,  pleasant and cooperative in NAD Head:   Normocephalic and atraumatic. Neck:  Supple; no masses or thyromegaly. Lungs:  Clear throughout to auscultation, normal respiratory effort.    Heart:  +S1, +S2, Regular rate and rhythm, No edema. Abdomen:  Soft, nontender and nondistended. Normal bowel sounds, without guarding,  and without rebound.   Neurologic:  Alert and  oriented x4;  grossly normal neurologically.  Impression/Plan: Jermaine Ibaharles G Briner Sr. is here for an endoscopy  to be performed for  evaluation of healing of prior peptic ulcers    Risks, benefits, limitations, and alternatives regarding endoscopy have been reviewed with the patient.  Questions have been answered.  All parties agreeable.   Wyline MoodKiran Virgil Lightner, MD  04/04/2018, 9:26 AM

## 2018-04-04 NOTE — Anesthesia Post-op Follow-up Note (Signed)
Anesthesia QCDR form completed.        

## 2018-04-04 NOTE — Op Note (Signed)
[facility] Gastroenterology Patient Name: Jermaine SableCharles [name] Procedure Date: 04/04/2018 9:38 AM MRN: [id number] Account #: [id number] Date of Birth: Mar 18, 1929 Admit Type: Outpatient Age: 84 [facility]OM 1 Gender: Male Note Status: Finalized Procedure:            Upper GI endoscopy Indications:          Follow-up of acute gastric ulcer Providers:            Wyline MoodKiran Lamari Youngers MD, MD Referring MD:         Marya AmslerMarshall W. Dareen PianoAnderson MD, MD (Referring MD) Medicines:            Monitored Anesthesia Care Complications:        No immediate complications. Procedure:            Pre-Anesthesia Assessment:                       - Prior to the procedure, a History and Physical was                        performed, and patient medications, allergies and                        sensitivities were reviewed. The patient's tolerance of                        previous anesthesia was reviewed.                       - The risks and benefits of the procedure and the                        sedation options and risks were discussed with the                        patient. All questions were answered and informed                        consent was obtained.                       - ASA Grade Assessment: III - A patient with severe                        systemic disease.                       After obtaining informed consent, the endoscope was                        passed under direct vision. Throughout the procedure,                        the patient's blood pressure, pulse, and oxygen                        saturations were monitored continuously. The Endoscope                        was introduced through the mouth, and advanced to the  third part of duodenum. The upper GI endoscopy was                        accomplished with ease. The patient tolerated the                        procedure well. Findings:      The esophagus was normal.      The stomach was normal.    The examined duodenum was normal. Impression:           - Normal esophagus.                       - Normal stomach.                       - Normal examined duodenum.                       - No specimens collected. Recommendation:       - Discharge patient to home (with escort).                       - Resume previous diet.                       - Continue present medications. Procedure Code(s):    --- Professional ---                       [phone], Esophagogastroduodenoscopy, flexible, transoral;                        diagnostic, including collection of specimen(s) by                        brushing or washing, when performed (separate procedure) Diagnosis Code(s):    --- Professional ---                       K25.3, Acute gastric ulcer without hemorrhage or                        perforation CPT copyright 2017 American Medical Association. All rights reserved. The codes documented in this report are preliminary and upon coder review may  be revised to meet current compliance requirements. Wyline Mood, MD Wyline Mood MD, MD 04/04/2018 9:48:55 AM This report has been signed electronically. Number of Addenda: 0 Note Initiated On: 04/04/2018 9:38 AM      [facility]

## 2018-04-04 NOTE — Transfer of Care (Signed)
Immediate Anesthesia Transfer of Care Note  Patient: Jermaine [name].  Procedure(s) Performed: ESOPHAGOGASTRODUODENOSCOPY (EGD) WITH PROPOFOL (N/A )  Patient Location: PACU  Anesthesia Type:General  Level of Consciousness: awake  Airway & Oxygen Therapy: Patient Spontanous Breathing and Patient connected to nasal cannula oxygen  Post-op Assessment: Report given to RN and Post -op Vital signs reviewed and stable  Post vital signs: Reviewed and stable  Last Vitals:  Vitals Value Taken Time  BP 100/49 04/04/2018  9:54 AM  Temp    Pulse 59 04/04/2018  9:55 AM  Resp 25 04/04/2018  9:55 AM  SpO2 100 % 04/04/2018  9:55 AM  Vitals shown include unvalidated device data.  Last Pain:  Vitals:   04/04/18 0953  TempSrc: (P) Tympanic  PainSc:          Complications: No apparent anesthesia complications

## 2018-04-04 NOTE — Anesthesia Postprocedure Evaluation (Signed)
Anesthesia Post Note  Patient: Earlyne IbaCharles G Starry Sr.  Procedure(s) Performed: ESOPHAGOGASTRODUODENOSCOPY (EGD) WITH PROPOFOL (N/A )  Patient location during evaluation: Endoscopy Anesthesia Type: General Level of consciousness: awake and alert and oriented Pain management: pain level controlled Vital Signs Assessment: post-procedure vital signs reviewed and stable Respiratory status: spontaneous breathing, nonlabored ventilation and respiratory function stable Cardiovascular status: blood pressure returned to baseline and stable Postop Assessment: no signs of nausea or vomiting Anesthetic complications: no     Last Vitals:  Vitals:   04/04/18 0953 04/04/18 0956  BP: (!) 100/49 (!) 100/49  Pulse:  (!) 59  Resp: 18 (!) 25  Temp: (!) 36.1 C 36.6 C  SpO2: 96% 100%    Last Pain:  Vitals:   04/04/18 0956  TempSrc: Oral  PainSc: 0-No pain                 Sailor Hevia

## 2018-04-04 NOTE — Anesthesia Preprocedure Evaluation (Signed)
Anesthesia Evaluation  Patient identified by MRN, date of birth, ID band Patient awake    Reviewed: Allergy & Precautions, NPO status , Patient's Chart, lab work & pertinent test results  History of Anesthesia Complications Negative for: history of anesthetic complications  Airway Mallampati: II  TM Distance: >3 FB Neck ROM: Full    Dental  (+) Missing, Poor Dentition, Implants   Pulmonary sleep apnea , COPD,  oxygen dependent,    breath sounds clear to auscultation- rhonchi (-) wheezing      Cardiovascular hypertension, Pt. on medications + CAD, + CABG (2008) and +CHF (EF 20%)   Rhythm:Regular Rate:Normal - Systolic murmurs and - Diastolic murmurs Echo 02/10/17: SEVERE LV SYSTOLIC DYSFUNCTION (EF 20%) WITH MILD LVH NORMAL RIGHT VENTRICULAR SYSTOLIC FUNCTION SEVERE VALVULAR REGURGITATION (severe MR) NO VALVULAR STENOSIS   Neuro/Psych PSYCHIATRIC DISORDERS Depression Parkinson's disease     GI/Hepatic Neg liver ROS, PUD, GERD  ,  Endo/Other  diabetes, Oral Hypoglycemic Agents  Renal/GU negative Renal ROS     Musculoskeletal negative musculoskeletal ROS (+)   Abdominal (+) + obese,   Peds  Hematology negative hematology ROS (+)   Anesthesia Other Findings Past Medical History: No date: CHF (congestive heart failure) (HCC) No date: COPD (chronic obstructive pulmonary disease) (HCC) No date: Diabetes mellitus without complication (HCC) No date: Hypertension No date: LVH (left ventricular hypertrophy) No date: OSA (obstructive sleep apnea) 2008: Parkinson's disease (HCC)   Reproductive/Obstetrics                             Anesthesia Physical  Anesthesia Plan  ASA: III  Anesthesia Plan: General   Post-op Pain Management:    Induction: Intravenous  PONV Risk Score and Plan: 1 and Propofol infusion  Airway Management Planned: Natural Airway  Additional Equipment:    Intra-op Plan:   Post-operative Plan:   Informed Consent: I have reviewed the patients History and Physical, chart, labs and discussed the procedure including the risks, benefits and alternatives for the proposed anesthesia with the patient or authorized representative who has indicated his/her understanding and acceptance.   Dental advisory given  Plan Discussed with: CRNA and Anesthesiologist  Anesthesia Plan Comments:         Anesthesia Quick Evaluation

## 2018-04-05 ENCOUNTER — Encounter: Payer: Self-pay | Admitting: Gastroenterology

## 2018-04-06 ENCOUNTER — Telehealth: Payer: Self-pay | Admitting: Gastroenterology

## 2018-04-06 DIAGNOSIS — A048 Other specified bacterial intestinal infections: Secondary | ICD-10-CM

## 2018-04-06 DIAGNOSIS — K279 Peptic ulcer, site unspecified, unspecified as acute or chronic, without hemorrhage or perforation: Secondary | ICD-10-CM

## 2018-04-06 NOTE — Telephone Encounter (Signed)
Pt daughter is calling to find out if it is ok for pt to start taking his rx for acid reflux   rx Pantoprasole sodium 40 mg please call her

## 2018-04-07 MED ORDER — PANTOPRAZOLE SODIUM 40 MG PO TBEC: 40.0000 mg | DELAYED_RELEASE_TABLET | Freq: Two times a day (BID) | ORAL | 0 refills | Status: AC

## 2018-04-07 NOTE — Telephone Encounter (Signed)
LVM for daughter stating refill sent. Patient can continue medication. Callback if necessary.  - Pt daughter is calling to find out if it is ok for pt to start taking his rx for acid reflux   rx Pantoprasole sodium 40 mg please call her

## 2018-04-21 ENCOUNTER — Other Ambulatory Visit: Payer: Self-pay

## 2019-03-14 DEATH — deceased
